# Patient Record
Sex: Male | Born: 1950
Health system: Southern US, Community
[De-identification: ages and names within clinical notes are randomized; demographics above are authoritative.]

## PROBLEM LIST (undated history)

## (undated) DIAGNOSIS — Z8619 Personal history of other infectious and parasitic diseases: Secondary | ICD-10-CM

## (undated) DIAGNOSIS — Z87442 Personal history of urinary calculi: Secondary | ICD-10-CM

## (undated) DIAGNOSIS — K2270 Barrett's esophagus without dysplasia: Secondary | ICD-10-CM

## (undated) DIAGNOSIS — C44611 Basal cell carcinoma of skin of unspecified upper limb, including shoulder: Secondary | ICD-10-CM

## (undated) DIAGNOSIS — B029 Zoster without complications: Secondary | ICD-10-CM

## (undated) DIAGNOSIS — K219 Gastro-esophageal reflux disease without esophagitis: Secondary | ICD-10-CM

## (undated) DIAGNOSIS — I2510 Atherosclerotic heart disease of native coronary artery without angina pectoris: Secondary | ICD-10-CM

## (undated) DIAGNOSIS — H01003 Unspecified blepharitis right eye, unspecified eyelid: Secondary | ICD-10-CM

## (undated) DIAGNOSIS — I340 Nonrheumatic mitral (valve) insufficiency: Secondary | ICD-10-CM

## (undated) DIAGNOSIS — I8390 Asymptomatic varicose veins of unspecified lower extremity: Secondary | ICD-10-CM

## (undated) DIAGNOSIS — R011 Cardiac murmur, unspecified: Secondary | ICD-10-CM

## (undated) DIAGNOSIS — N400 Enlarged prostate without lower urinary tract symptoms: Secondary | ICD-10-CM

## (undated) DIAGNOSIS — J189 Pneumonia, unspecified organism: Secondary | ICD-10-CM

## (undated) HISTORY — DX: Nonrheumatic mitral (valve) insufficiency: I34.0

## (undated) HISTORY — DX: Gastro-esophageal reflux disease without esophagitis: K21.9

## (undated) HISTORY — DX: Asymptomatic varicose veins of unspecified lower extremity: I83.90

## (undated) HISTORY — PX: TRIGEMINAL NERVE DECOMPRESSION: SHX2579

## (undated) HISTORY — PX: WISDOM TOOTH EXTRACTION: SHX21

## (undated) HISTORY — PX: UPPER GASTROINTESTINAL ENDOSCOPY: SHX188

## (undated) HISTORY — DX: Benign prostatic hyperplasia without lower urinary tract symptoms: N40.0

## (undated) HISTORY — PX: TONSILLECTOMY: SUR1361

## (undated) HISTORY — DX: Barrett's esophagus without dysplasia: K22.70

## (undated) HISTORY — PX: COLONOSCOPY: SHX174

## (undated) HISTORY — DX: Personal history of other infectious and parasitic diseases: Z86.19

## (undated) HISTORY — DX: Unspecified blepharitis right eye, unspecified eyelid: H01.003

## (undated) HISTORY — DX: Basal cell carcinoma of skin of unspecified upper limb, including shoulder: C44.611

## (undated) HISTORY — DX: Cardiac murmur, unspecified: R01.1

## (undated) HISTORY — DX: Zoster without complications: B02.9

---

## 2007-01-02 DIAGNOSIS — I340 Nonrheumatic mitral (valve) insufficiency: Secondary | ICD-10-CM

## 2007-01-02 HISTORY — DX: Nonrheumatic mitral (valve) insufficiency: I34.0

## 2010-05-15 LAB — HM COLONOSCOPY

## 2011-01-02 DIAGNOSIS — H01003 Unspecified blepharitis right eye, unspecified eyelid: Secondary | ICD-10-CM

## 2011-01-02 DIAGNOSIS — B029 Zoster without complications: Secondary | ICD-10-CM | POA: Insufficient documentation

## 2011-01-02 HISTORY — DX: Zoster without complications: B02.9

## 2011-01-02 HISTORY — DX: Unspecified blepharitis right eye, unspecified eyelid: H01.003

## 2011-04-23 DIAGNOSIS — H40009 Preglaucoma, unspecified, unspecified eye: Secondary | ICD-10-CM | POA: Insufficient documentation

## 2013-03-01 DIAGNOSIS — C44611 Basal cell carcinoma of skin of unspecified upper limb, including shoulder: Secondary | ICD-10-CM

## 2013-03-01 HISTORY — DX: Basal cell carcinoma of skin of unspecified upper limb, including shoulder: C44.611

## 2013-03-18 HISTORY — PX: SPIROMETRY: SHX456

## 2013-03-18 LAB — HEPATIC FUNCTION PANEL
ALK PHOS: 59 U/L (ref 25–125)
ALT: 15 U/L (ref 10–40)
AST: 13 U/L — AB (ref 14–40)
Bilirubin, Total: 1.1 mg/dL

## 2013-03-18 LAB — PSA: PSA: 0.4

## 2013-03-18 LAB — LIPID PANEL
CHOLESTEROL: 139 mg/dL (ref 0–200)
HDL: 44 mg/dL (ref 35–70)
LDL CALC: 71 mg/dL
LDl/HDL Ratio: 3.2
TRIGLYCERIDES: 121 mg/dL (ref 40–160)

## 2013-03-18 LAB — BASIC METABOLIC PANEL
BUN: 19 mg/dL (ref 4–21)
CREATININE: 1.1 mg/dL (ref 0.6–1.3)
GLUCOSE: 101 mg/dL
POTASSIUM: 5 mmol/L (ref 3.4–5.3)
Sodium: 142 mmol/L (ref 137–147)

## 2013-03-18 LAB — HEMOGLOBIN A1C: HEMOGLOBIN A1C: 5.4 % (ref 4.0–6.0)

## 2013-05-01 DIAGNOSIS — Z8619 Personal history of other infectious and parasitic diseases: Secondary | ICD-10-CM

## 2013-05-01 HISTORY — DX: Personal history of other infectious and parasitic diseases: Z86.19

## 2013-05-18 LAB — HM COLONOSCOPY

## 2014-02-24 LAB — HEMOGLOBIN A1C: Hgb A1c MFr Bld: 5.5 % (ref 4.0–6.0)

## 2014-02-24 LAB — HEPATIC FUNCTION PANEL
ALK PHOS: 58 U/L (ref 25–125)
ALT: 18 U/L (ref 10–40)
AST: 16 U/L (ref 14–40)
BILIRUBIN, TOTAL: 1.2 mg/dL

## 2014-02-24 LAB — LIPID PANEL
Cholesterol: 162 mg/dL (ref 0–200)
HDL: 50 mg/dL (ref 35–70)
LDL CALC: 91 mg/dL
LDL/HDL RATIO: 3.2
Triglycerides: 105 mg/dL (ref 40–160)

## 2014-02-24 LAB — BASIC METABOLIC PANEL
BUN: 19 mg/dL (ref 4–21)
Creatinine: 1.1 mg/dL (ref 0.6–1.3)
GLUCOSE: 91 mg/dL
Potassium: 4.7 mmol/L (ref 3.4–5.3)
Sodium: 140 mmol/L (ref 137–147)

## 2014-02-24 LAB — CBC AND DIFFERENTIAL
HEMATOCRIT: 52 % (ref 41–53)
Hemoglobin: 16.4 g/dL (ref 13.5–17.5)
NEUTROS ABS: 3247 /uL
Platelets: 211 10*3/uL (ref 150–399)
WBC: 4.2 10*3/mL

## 2014-09-01 ENCOUNTER — Telehealth: Payer: Self-pay | Admitting: General Practice

## 2014-09-01 NOTE — Telephone Encounter (Signed)
Okay to schedule at [employer]. Please have Pt have pertinent records faxed to [place], or have him bring them with him to his appt. Thank you.

## 2014-09-01 NOTE — Telephone Encounter (Signed)
lvm advising patient of message below °

## 2014-09-01 NOTE — Telephone Encounter (Signed)
Burney Calzadilla HOO#[id number] was seen today and her daughter Marco Raper stated CMA approved Dr. Larose Kells taking patient on as a new patient Breckyn Ticas).

## 2014-11-09 ENCOUNTER — Telehealth: Payer: Self-pay

## 2014-11-09 NOTE — Telephone Encounter (Signed)
Pre Visit call completed. 

## 2014-11-09 NOTE — Telephone Encounter (Signed)
Pre visit call completed 

## 2014-11-10 ENCOUNTER — Ambulatory Visit (INDEPENDENT_AMBULATORY_CARE_PROVIDER_SITE_OTHER): Payer: Managed Care, Other (non HMO) | Admitting: Internal Medicine

## 2014-11-10 VITALS — BP 118/78 | HR 76 | Temp 98.2°F | Ht 74.0 in | Wt 236.4 lb

## 2014-11-10 DIAGNOSIS — Z09 Encounter for follow-up examination after completed treatment for conditions other than malignant neoplasm: Secondary | ICD-10-CM

## 2014-11-10 DIAGNOSIS — Z125 Encounter for screening for malignant neoplasm of prostate: Secondary | ICD-10-CM

## 2014-11-10 DIAGNOSIS — Z0000 Encounter for general adult medical examination without abnormal findings: Secondary | ICD-10-CM | POA: Diagnosis not present

## 2014-11-10 DIAGNOSIS — Z23 Encounter for immunization: Secondary | ICD-10-CM

## 2014-11-10 MED ORDER — PANTOPRAZOLE SODIUM 40 MG PO TBEC: 40.0000 mg | DELAYED_RELEASE_TABLET | Freq: Every day | ORAL | Status: DC

## 2014-11-10 NOTE — Patient Instructions (Signed)
Front desk: please get a ROI form, fax it to the [facility], all records   Please schedule labs to be done within few days (fasting)      Next visit  for a   Physical exam in one year    Please schedule an appointment at the front desk Please come back fasting

## 2014-11-10 NOTE — Progress Notes (Signed)
Subjective:    Patient ID: Jacob Ray, male    DOB: [birth year], 64 y.o.   MRN: [id number]  DOS:  11/10/2014 Type of visit - description : New patient, CPX, transferring from the [place] clinic Interval history: No major concerns, would like to refill Protonix  Review of Systems  Constitutional: No fever. No chills. No unexplained wt changes. No unusual sweats  HEENT: No dental problems, no ear discharge, no facial swelling, no voice changes. No eye discharge, no eye  redness , no  intolerance to light   Respiratory: No wheezing , no  difficulty breathing. No cough , no mucus production  Cardiovascular: No CP, no leg swelling , no  Palpitations  GI: no nausea, no vomiting, no diarrhea , no  abdominal pain.  No blood in the stools. No dysphagia, no odynophagia    Endocrine: No polyphagia, no polyuria , no polydipsia  GU: No dysuria, gross hematuria, difficulty urinating. No urinary urgency, no frequency.  Musculoskeletal: No joint swellings or unusual aches or pains  Skin: No change in the color of the skin, palor , no  Rash  Allergic, immunologic: No environmental allergies , no  food allergies  Neurological: No dizziness no  syncope. No headaches. No diplopia, no slurred, no slurred speech, no motor deficits, no facial  Numbness  Hematological: No enlarged lymph nodes, no easy bruising , no unusual bleedings  Psychiatry: No suicidal ideas, no hallucinations, no beavior problems, no confusion.  No unusual/severe anxiety, no depression   Past Medical History  Diagnosis Date  . Blepharitis, right eye 2013  . Shingles 2013  . GERD (gastroesophageal reflux disease)   . Heart murmur     Past Surgical History  Procedure Laterality Date  . Tonsillectomy    . Wisdom tooth extraction      Social History   Social History  . Marital Status: Married    Spouse Name: N/A  . Number of Children: 1  . Years of Education: N/A   Occupational History  . sales      Social History Main Topics  . Smoking status: Never Smoker   . Smokeless tobacco: Not on file  . Alcohol Use: 0.0 oz/week    0 Standard drinks or equivalent per week     Comment: Occasional  . Drug Use: No  . Sexual Activity: Not on file   Other Topics Concern  . Not on file   Social History Narrative   Household-- pt and wife   Family History  Problem Relation Age of Onset  . CAD Father 73  . Cancer Father     Prostate Cancer  . Hypertension Father   . Diabetes Neg Hx   . Colon cancer Neg Hx     F ?  . Prostate cancer Neg Hx     F ?         Medication List       This list is accurate as of: 11/10/14 11:59 PM.  Always use your most recent med list.               pantoprazole 40 MG tablet  Commonly known as:  PROTONIX  Take 1 tablet (40 mg total) by mouth daily.           Objective:   Physical Exam BP 118/78 mmHg  Pulse 76  Temp(Src) 98.2 F (36.8 C) (Oral)  Ht 6\' 2"  (1.88 m)  Wt 236 lb 6 oz (107.219 kg)  BMI 30.34 kg/m2  SpO2 99% General:   Well developed, well nourished . NAD.  Neck:  No thyromegaly HEENT:  Normocephalic . Face symmetric, atraumatic Lungs:  CTA B Normal respiratory effort, no intercostal retractions, no accessory muscle use. Heart: RRR,  no murmur.  No pretibial edema bilaterally  Abdomen:  Not distended, soft, non-tender. No rebound or rigidity.  Rectal:  External abnormalities: none. Normal sphincter tone. No rectal masses or tenderness.  Stool brown  Prostate: Prostate gland firm and smooth, no enlargement, nodularity, tenderness, mass, asymmetry or induration.  Skin: Exposed areas without rash. Not pale. Not jaundice Neurologic:  alert & oriented X3.  Speech normal, gait appropriate for age and unassisted Strength symmetric and appropriate for age.  Psych: Cognition and judgment appear intact.  Cooperative with normal attention span and concentration.  Behavior appropriate. No anxious or depressed  appearing.    Assessment & Plan:   Assessment>  GERD -- GI [name] , s/p EGD x 2, no major findings per pt  Shingles 2013  Plan  GERD: Refill Protonix New patient: Get records, used to see Dr. Richardson Chiquito PA at the [facility] RTC 1 year

## 2014-11-10 NOTE — Assessment & Plan Note (Signed)
Td today flushot today CCS:  2 cscopes before, [name], had polyps , last Cscope ~ 2015 WNL per pt. Will get records  Prostate ca screening: DRE neg, labs Diet-exercise discussed

## 2014-11-10 NOTE — Telephone Encounter (Signed)
Pre Visit call completed. 

## 2014-11-10 NOTE — Progress Notes (Signed)
Pre visit review using our clinic review tool, if applicable. No additional management support is needed unless otherwise documented below in the visit note. 

## 2014-11-11 DIAGNOSIS — Z09 Encounter for follow-up examination after completed treatment for conditions other than malignant neoplasm: Secondary | ICD-10-CM | POA: Insufficient documentation

## 2014-11-11 NOTE — Assessment & Plan Note (Signed)
GERD: Refill Protonix New patient: Get records, used to see Dr. Richardson Chiquito PA at the [facility] RTC 1 year

## 2014-11-15 ENCOUNTER — Other Ambulatory Visit (INDEPENDENT_AMBULATORY_CARE_PROVIDER_SITE_OTHER): Payer: Managed Care, Other (non HMO)

## 2014-11-15 DIAGNOSIS — Z125 Encounter for screening for malignant neoplasm of prostate: Secondary | ICD-10-CM

## 2014-11-15 DIAGNOSIS — R7989 Other specified abnormal findings of blood chemistry: Secondary | ICD-10-CM

## 2014-11-15 DIAGNOSIS — Z0000 Encounter for general adult medical examination without abnormal findings: Secondary | ICD-10-CM | POA: Diagnosis not present

## 2014-11-15 LAB — COMPREHENSIVE METABOLIC PANEL
ALBUMIN: 4.1 g/dL (ref 3.5–5.2)
ALK PHOS: 52 U/L (ref 39–117)
ALT: 18 U/L (ref 0–53)
AST: 12 U/L (ref 0–37)
BILIRUBIN TOTAL: 0.8 mg/dL (ref 0.2–1.2)
BUN: 22 mg/dL (ref 6–23)
CO2: 29 mEq/L (ref 19–32)
CREATININE: 1.17 mg/dL (ref 0.40–1.50)
Calcium: 9.3 mg/dL (ref 8.4–10.5)
Chloride: 105 mEq/L (ref 96–112)
GFR: 66.99 mL/min (ref >60.00)
GLUCOSE: 104 mg/dL — AB (ref 70–99)
POTASSIUM: 4.4 meq/L (ref 3.5–5.1)
SODIUM: 141 meq/L (ref 135–145)
TOTAL PROTEIN: 6.7 g/dL (ref 6.0–8.3)

## 2014-11-15 LAB — CBC WITH DIFFERENTIAL/PLATELET
BASOS ABS: 0 10*3/uL (ref 0.0–0.1)
BASOS PCT: 0.8 % (ref 0.0–3.0)
EOS ABS: 0.1 10*3/uL (ref 0.0–0.7)
Eosinophils Relative: 2.8 % (ref 0.0–5.0)
HEMATOCRIT: 45.8 % (ref 39.0–52.0)
Hemoglobin: 15.8 g/dL (ref 13.0–17.0)
LYMPHS ABS: 1.2 10*3/uL (ref 0.7–4.0)
LYMPHS PCT: 26.4 % (ref 12.0–46.0)
MCHC: 34.4 g/dL (ref 30.0–36.0)
MCV: 93.2 fl (ref 78.0–100.0)
Monocytes Absolute: 0.4 10*3/uL (ref 0.1–1.0)
Monocytes Relative: 8.5 % (ref 3.0–12.0)
NEUTROS ABS: 2.8 10*3/uL (ref 1.4–7.7)
NEUTROS PCT: 61.5 % (ref 43.0–77.0)
PLATELETS: 233 10*3/uL (ref 150.0–400.0)
RBC: 4.92 Mil/uL (ref 4.22–5.81)
RDW: 12.8 % (ref 11.5–15.5)
WBC: 4.6 10*3/uL (ref 4.0–10.5)

## 2014-11-15 LAB — LIPID PANEL
CHOLESTEROL: 171 mg/dL (ref 0–200)
HDL: 44.7 mg/dL (ref >39.00)
NonHDL: 126.67
Total CHOL/HDL Ratio: 4
Triglycerides: 213 mg/dL — ABNORMAL HIGH (ref 0.0–149.0)
VLDL: 42.6 mg/dL — ABNORMAL HIGH (ref 0.0–40.0)

## 2014-11-15 LAB — TSH: TSH: 4.06 u[IU]/mL (ref 0.35–4.50)

## 2014-11-15 LAB — LDL CHOLESTEROL, DIRECT: LDL DIRECT: 93 mg/dL

## 2014-11-15 LAB — PSA: PSA: 0.45 ng/mL (ref 0.10–4.00)

## 2014-12-01 ENCOUNTER — Telehealth: Payer: Self-pay | Admitting: *Deleted

## 2014-12-01 NOTE — Telephone Encounter (Signed)
Forwarded to Dr. Paz. JG//CMA 

## 2014-12-13 ENCOUNTER — Encounter: Payer: Self-pay | Admitting: Internal Medicine

## 2014-12-13 NOTE — Telephone Encounter (Signed)
Labs reviewed, most recent labs are as follows:  Labs 03/18/2013: Cholesterol 139, LDL 71, creatinine 1.1, HIV test negative-A1c 5.4, PSA 0.4. C-reactive protein 0.1  Labs 02/24/2014: Cholesterol 162, LDL 91, creatinine function normal, A1c 5.5, hemoglobin 16.4.

## 2014-12-15 ENCOUNTER — Telehealth: Payer: Self-pay | Admitting: *Deleted

## 2014-12-15 ENCOUNTER — Telehealth: Payer: Self-pay

## 2014-12-15 NOTE — Telephone Encounter (Signed)
Received patient Medical Records, forwarded to provider/SLS

## 2014-12-15 NOTE — Telephone Encounter (Signed)
Medical records disc received from [facility] and placed in [place] for pick up

## 2014-12-20 ENCOUNTER — Telehealth: Payer: Self-pay | Admitting: Internal Medicine

## 2014-12-20 MED ORDER — PANTOPRAZOLE SODIUM 40 MG PO TBEC: 40.0000 mg | DELAYED_RELEASE_TABLET | Freq: Every day | ORAL | Status: DC

## 2014-12-20 NOTE — Telephone Encounter (Signed)
Relation to [id number] Call back [place], [place] - [address] [phone] (Phone) [phone] (Fax)        Reason for call:  Patient requesting a 90 day supply of pantoprazole (PROTONIX) 40 MG tablet  instead of 12 refills, patient states he would prefer to go to the pharmacy every quarter then every month. Please advise patient directly

## 2014-12-20 NOTE — Telephone Encounter (Signed)
Rx resent, #90 and 3 refills.

## 2014-12-29 ENCOUNTER — Encounter: Payer: Self-pay | Admitting: Internal Medicine

## 2015-04-06 NOTE — Telephone Encounter (Addendum)
More than 100 pages of records available for review, we'll only keep the most relevant ones. ---Colonoscopy done 05-2010, 05/18/2013, inadequate prep, no polyp, no BX, repeat in 5 years --- Esophageal  stricture, dilatation 07/12/2010 ---History of Barrett's esophagus, last EGD 05/04/2013: Pathology: Barrett's esophagitis, eosinophilic esophagitis, eosinophilic gastritis, gastric polyp, H. pylori infection. ---Repeat EGD planned for  May 2018. --- History of BPH, has seen urology. --- Skin cancer: BCC per biopsy 03/24/2013 --- Reportedly mild mitral regurgitation by echo 2009 --- Remote history of low vitamin D   We'll discuss above on return to the office

## 2015-04-07 ENCOUNTER — Encounter: Payer: Self-pay | Admitting: Internal Medicine

## 2015-11-14 ENCOUNTER — Ambulatory Visit (INDEPENDENT_AMBULATORY_CARE_PROVIDER_SITE_OTHER): Payer: Managed Care, Other (non HMO) | Admitting: Internal Medicine

## 2015-11-14 ENCOUNTER — Encounter: Payer: Self-pay | Admitting: Internal Medicine

## 2015-11-14 ENCOUNTER — Telehealth: Payer: Self-pay

## 2015-11-14 VITALS — BP 112/76 | HR 74 | Temp 98.1°F | Resp 14 | Ht 74.0 in | Wt 237.2 lb

## 2015-11-14 DIAGNOSIS — Z23 Encounter for immunization: Secondary | ICD-10-CM | POA: Diagnosis not present

## 2015-11-14 DIAGNOSIS — Z0000 Encounter for general adult medical examination without abnormal findings: Secondary | ICD-10-CM

## 2015-11-14 DIAGNOSIS — Z8619 Personal history of other infectious and parasitic diseases: Secondary | ICD-10-CM

## 2015-11-14 LAB — LIPID PANEL
CHOL/HDL RATIO: 3
Cholesterol: 157 mg/dL (ref 0–200)
HDL: 44.9 mg/dL (ref >39.00)
LDL CALC: 77 mg/dL (ref 0–99)
NONHDL: 112
TRIGLYCERIDES: 173 mg/dL — AB (ref 0.0–149.0)
VLDL: 34.6 mg/dL (ref 0.0–40.0)

## 2015-11-14 LAB — URINALYSIS, ROUTINE W REFLEX MICROSCOPIC
Bilirubin Urine: NEGATIVE
Hgb urine dipstick: NEGATIVE
KETONES UR: NEGATIVE
Leukocytes, UA: NEGATIVE
NITRITE: NEGATIVE
RBC / HPF: NONE SEEN (ref 0–?)
SPECIFIC GRAVITY, URINE: 1.02 (ref 1.000–1.030)
Total Protein, Urine: NEGATIVE
Urine Glucose: NEGATIVE
Urobilinogen, UA: 0.2 (ref 0.0–1.0)
pH: 6 (ref 5.0–8.0)

## 2015-11-14 LAB — BASIC METABOLIC PANEL
BUN: 17 mg/dL (ref 6–23)
CALCIUM: 9.1 mg/dL (ref 8.4–10.5)
CHLORIDE: 105 meq/L (ref 96–112)
CO2: 27 meq/L (ref 19–32)
Creatinine, Ser: 1.11 mg/dL (ref 0.40–1.50)
GFR: 70.96 mL/min (ref >60.00)
GLUCOSE: 98 mg/dL (ref 70–99)
Potassium: 4.4 mEq/L (ref 3.5–5.1)
SODIUM: 138 meq/L (ref 135–145)

## 2015-11-14 LAB — CBC WITH DIFFERENTIAL/PLATELET
BASOS PCT: 0.5 % (ref 0.0–3.0)
Basophils Absolute: 0 10*3/uL (ref 0.0–0.1)
EOS PCT: 1.8 % (ref 0.0–5.0)
Eosinophils Absolute: 0.1 10*3/uL (ref 0.0–0.7)
HEMATOCRIT: 43.9 % (ref 39.0–52.0)
HEMOGLOBIN: 15.1 g/dL (ref 13.0–17.0)
LYMPHS PCT: 34 % (ref 12.0–46.0)
Lymphs Abs: 1.5 10*3/uL (ref 0.7–4.0)
MCHC: 34.5 g/dL (ref 30.0–36.0)
MCV: 92 fl (ref 78.0–100.0)
MONO ABS: 0.4 10*3/uL (ref 0.1–1.0)
MONOS PCT: 10.1 % (ref 3.0–12.0)
Neutro Abs: 2.3 10*3/uL (ref 1.4–7.7)
Neutrophils Relative %: 53.6 % (ref 43.0–77.0)
Platelets: 238 10*3/uL (ref 150.0–400.0)
RBC: 4.77 Mil/uL (ref 4.22–5.81)
RDW: 13 % (ref 11.5–15.5)
WBC: 4.3 10*3/uL (ref 4.0–10.5)

## 2015-11-14 NOTE — Assessment & Plan Note (Signed)
GERD, stricture, Barrett's, history of H. Pylori infection: asx, not taking PPIs. Will check a H. Pylori breath test. I do recommend to proceed with endoscopy 05/2016. Kidney stone: Patient apparently passed a stone few weeks ago, did not seek medical attention. Now is symptom free. Checking a UA Varicose veins, right ankle: Recommend compression stockings to prevent further growth and problems. RTC one year

## 2015-11-14 NOTE — Telephone Encounter (Signed)
Wellness form completed. Copy sent for scanning, given to Pt also. Original form mailed to:  Iona Hansen Attn: Randolm Idol [address] [place] [address]

## 2015-11-14 NOTE — Assessment & Plan Note (Addendum)
Td 2016; flushot today   CCS:   [name],  records reviewed ---->  Colonoscopy done 05-2010, 05/18/2013, inadequate prep, no polyp, no BX, repeat in 5 years Prostate ca screening: + FH,  DRE neg 2016, check a PSA  Diet-exercise discussed

## 2015-11-14 NOTE — Progress Notes (Signed)
Subjective:    Patient ID: Jacob Ray, male    DOB: July 15, 1950, 65 y.o.   MRN: [id number]  DOS:  11/14/2015 Type of visit - description : CPX Interval history:since the last timew he was here, he thinks he passed a stone .See below    Review of Systems  Constitutional: No fever. No chills. No unexplained wt changes. No unusual sweats  HEENT: No dental problems, no ear discharge, no facial swelling, no voice changes. No eye discharge, no eye  redness , no  intolerance to light   Respiratory: No wheezing , no  difficulty breathing. No cough , no mucus production  Cardiovascular: No CP, no leg swelling , no  Palpitations  GI: no nausea, no vomiting, no diarrhea , no  abdominal pain.  No blood in the stools. No dysphagia, no odynophagia    Endocrine: No polyphagia, no polyuria , no polydipsia  GU:  6 weeks ago, experiencing left back pain, shortly after he noted he passed a stone. Denies fever, chills, gross hematuria. No further symptoms.  No dysuria,  difficulty urinating. No urinary urgency, no frequency.  Musculoskeletal: No joint swellings or unusual aches or pains  Skin: reports varicose veins R ankle, no sx , stable x years   Allergic, immunologic: No environmental allergies , no  food allergies  Neurological: No dizziness no  syncope. No headaches. No diplopia, no slurred, no slurred speech, no motor deficits, no facial  Numbness  Hematological: No enlarged lymph nodes, no easy bruising , no unusual bleedings  Psychiatry: No suicidal ideas, no hallucinations, no beavior problems, no confusion.  No unusual/severe anxiety, no depression   Past Medical History:  Diagnosis Date  . Barrett's esophagus    EGD on 05/04/2013, next due in 5/18, [facility]  . BCC (basal cell carcinoma), arm 03/2013   Right forearm  . Blepharitis, right eye 2013  . BPH (benign prostatic hyperplasia)   . GERD (gastroesophageal reflux disease)   . Heart murmur   . History of  Helicobacter pylori infection 05/2013  . Mitral regurgitation    mild regurgitation per ECHO in 2009  . Mitral regurgitation 2009   mild per ECHO  . Shingles 2013  . Varicose veins    bilateral    Past Surgical History:  Procedure Laterality Date  . SPIROMETRY  03/18/2013   [facility]  . TONSILLECTOMY    . WISDOM TOOTH EXTRACTION      Social History   Social History  . Marital status: Married    Spouse name: N/A  . Number of children: 1  . Years of education: N/A   Occupational History  . sales     Social History Main Topics  . Smoking status: Never Smoker  . Smokeless tobacco: Never Used  . Alcohol use 0.0 oz/week     Comment: Occasional  . Drug use: No  . Sexual activity: Not on file   Other Topics Concern  . Not on file   Social History Narrative   Household-- pt and wife     Family History  Problem Relation Age of Onset  . CAD Father 71  . Hypertension Father   . Prostate cancer Father     Prostate Cancer  . Diabetes Neg Hx   . Colon cancer Neg Hx     F ?       Medication List    as of 11/14/2015  1:14 PM   You have not been prescribed any medications.  Objective:   Physical Exam BP 112/76 (BP Location: Left Arm, Patient Position: Sitting, Cuff Size: Normal)   Pulse 74   Temp 98.1 F (36.7 C) (Oral)   Resp 14   Ht 6\' 2"  (1.88 m)   Wt 237 lb 4 oz (107.6 kg)   SpO2 97%   BMI 30.46 kg/m   General:   Well developed, well nourished . NAD.  Neck: No  thyromegaly  HEENT:  Normocephalic . Face symmetric, atraumatic Lungs:  CTA B Normal respiratory effort, no intercostal retractions, no accessory muscle use. Heart: RRR,  no murmur.  No pretibial edema bilaterally  Abdomen:  Not distended, soft, non-tender. No rebound or rigidity.   Skin: medial aspect of R ankle w/  noticeable varicose veins without ulcers or phlebitis Neurologic:  alert & oriented X3.  Speech normal, gait appropriate for age and  unassisted Strength symmetric and appropriate for age.  Psych: Cognition and judgment appear intact.  Cooperative with normal attention span and concentration.  Behavior appropriate. No anxious or depressed appearing.    Assessment & Plan:  Assessment>  GERD --   [name] , s/p EGD x 2, no major findings per pt  Esophageal  stricture, dilatation 07/12/2010 History of Barrett's esophagus, last EGD 05/04/2013: Pathology: Barrett's esophagitis, eosinophilic esophagitis, eosinophilic gastritis, gastric polyp, H. pylori infection. Repeat EGD planned for  May 2018. H/o  BPH, has seen urology. Skin cancer: BCC per biopsy 03/24/2013 Reportedly mild mitral regurgitation by echo 2009 Remote history of low vitamin D  Shingles 2013 H/o kidney stone ~ 09-2015 per pt report Varicose veins, right ankle  PLAN: GERD, stricture, Barrett's, history of H. Pylori infection: asx, not taking PPIs. Will check a H. Pylori breath test. I do recommend to proceed with endoscopy 05/2016. Kidney stone: Patient apparently passed a stone few weeks ago, did not seek medical attention. Now is symptom free. Checking a UA Varicose veins, right ankle: Recommend compression stockings to prevent further growth and problems. RTC one year

## 2015-11-14 NOTE — Progress Notes (Signed)
Pre visit review using our clinic review tool, if applicable. No additional management support is needed unless otherwise documented below in the visit note. 

## 2015-11-14 NOTE — Patient Instructions (Signed)
GO TO THE LAB : Get the blood work     GO TO THE FRONT DESK Schedule your next appointment for a physical exam in one year.  You need endoscopy by May 2018, if they don't contact your regards this procedure please let me know

## 2015-11-15 LAB — H. PYLORI BREATH TEST: H. PYLORI BREATH TEST: NOT DETECTED

## 2016-04-27 ENCOUNTER — Telehealth: Payer: Self-pay | Admitting: Internal Medicine

## 2016-04-27 NOTE — Telephone Encounter (Signed)
Please advise 

## 2016-04-27 NOTE — Telephone Encounter (Signed)
At check out Jacob Ray stated that Jacob Ray is due a bone density test. She would like to have orders placed.    Please assist further.

## 2016-04-27 NOTE — Telephone Encounter (Signed)
Tried calling Pt, no answer, unable to leave voice message.  

## 2016-04-27 NOTE — Telephone Encounter (Signed)
A bone density test is not routinely done on males unless there is a compelling reason, I don't think he needs a bone density test.

## 2016-06-14 ENCOUNTER — Telehealth: Payer: Self-pay | Admitting: Internal Medicine

## 2016-06-14 DIAGNOSIS — K22719 Barrett's esophagus with dysplasia, unspecified: Secondary | ICD-10-CM

## 2016-06-14 DIAGNOSIS — K2270 Barrett's esophagus without dysplasia: Secondary | ICD-10-CM | POA: Insufficient documentation

## 2016-06-14 NOTE — Telephone Encounter (Signed)
GI referral placed

## 2016-06-14 NOTE — Telephone Encounter (Signed)
According to my notes he is due for a EGD dx Barrett's , please refer to GI (I think GI is at [facility]) and let pt know

## 2016-06-14 NOTE — Telephone Encounter (Signed)
Letter mailed to Pt.

## 2016-07-16 ENCOUNTER — Encounter: Payer: Self-pay | Admitting: Gastroenterology

## 2016-09-04 ENCOUNTER — Ambulatory Visit (INDEPENDENT_AMBULATORY_CARE_PROVIDER_SITE_OTHER): Payer: Managed Care, Other (non HMO) | Admitting: Gastroenterology

## 2016-09-04 ENCOUNTER — Encounter: Payer: Self-pay | Admitting: Gastroenterology

## 2016-09-04 VITALS — BP 92/68 | HR 84 | Ht 74.0 in | Wt 236.8 lb

## 2016-09-04 DIAGNOSIS — K219 Gastro-esophageal reflux disease without esophagitis: Secondary | ICD-10-CM

## 2016-09-04 DIAGNOSIS — Z8719 Personal history of other diseases of the digestive system: Secondary | ICD-10-CM

## 2016-09-04 NOTE — Patient Instructions (Signed)
We have requested records from [name] at [facility]. We will contact you once we have received these records.  If you are age 64 or older, your body mass index should be between 23-30. Your Body mass index is 30.4 kg/m. If this is out of the aforementioned range listed, please consider follow up with your Primary Care Provider.  If you are age 9 or younger, your body mass index should be between 19-25. Your Body mass index is 30.4 kg/m. If this is out of the aformentioned range listed, please consider follow up with your Primary Care Provider.

## 2016-09-04 NOTE — Progress Notes (Signed)
HPI :  66 y/o male with a history of Barrett's esophagus, GERD, H pylori, referred by Dr. [name] for management of GERD and BArrett's esophagus.   He denies much GERD which bothers him now. She denies any routine antacids, only PRN. He has been on PPIs in the past, he thinks he stopped it years ago. He denies any dysphagia, no swallowing difficulty.  No FH of esophageal cancer. No abdominal pains. No nausea or vomiting. Weight is stable. No cigarettes, chews tobacco one per week. He drinks on average 6 beers per weekend not during the week. He had an EGD in 2015, but does not know the results. He is not aware of being told he had Barrett's in the past.   Father had prostate cancer. No colon cancer.   He thinks he has had a colonoscopy done in 2015 but can't recall he results. Done in [place] at [place].   EGD reportedly done on 05/04/2013 Colonoscopy 05/18/2013:  Past Medical History:  Diagnosis Date  . Barrett's esophagus    EGD on 05/04/2013, next due in 5/18, [facility]  . BCC (basal cell carcinoma), arm 03/2013   Right forearm  . Blepharitis, right eye 2013  . BPH (benign prostatic hyperplasia)   . GERD (gastroesophageal reflux disease)   . Heart murmur   . History of Helicobacter pylori infection 05/2013  . Mitral regurgitation 2009   mild per ECHO  . Shingles 2013  . Varicose veins    bilateral     Past Surgical History:  Procedure Laterality Date  . SPIROMETRY  03/18/2013   [facility]  . TONSILLECTOMY    . WISDOM TOOTH EXTRACTION     Family History  Problem Relation Age of Onset  . CAD Father 77  . Hypertension Father   . Prostate cancer Father        Prostate Cancer  . Diabetes Neg Hx   . Colon cancer Neg Hx        F ?   Social History  Substance Use Topics  . Smoking status: Never Smoker  . Smokeless tobacco: Never Used  . Alcohol use 0.0 oz/week     Comment: Occasional   No current outpatient prescriptions on file.   No current  facility-administered medications for this visit.    Allergies  Allergen Reactions  . Tetanus Toxoids Other (See Comments)    Childhood allergy, TDAP given on 11/10/2014, no reported reaction     Review of Systems: All systems reviewed and negative except where noted in HPI.   Lab Results  Component Value Date   WBC 4.3 11/14/2015   HGB 15.1 11/14/2015   HCT 43.9 11/14/2015   MCV 92.0 11/14/2015   PLT 238.0 11/14/2015    Lab Results  Component Value Date   CREATININE 1.11 11/14/2015   BUN 17 11/14/2015   NA 138 11/14/2015   K 4.4 11/14/2015   CL 105 11/14/2015   CO2 27 11/14/2015    Lab Results  Component Value Date   ALT 18 11/15/2014   AST 12 11/15/2014   ALKPHOS 52 11/15/2014   BILITOT 0.8 11/15/2014     Physical Exam: BP 92/68   Pulse 84   Ht 6\' 2"  (1.88 m)   Wt 236 lb 12.8 oz (107.4 kg)   BMI 30.40 kg/m  Constitutional: Pleasant,well-developed, male in no acute distress. HEENT: Normocephalic and atraumatic. Conjunctivae are normal. No scleral icterus. Neck supple.  Cardiovascular: Normal rate, regular rhythm.  Pulmonary/chest: Effort normal and breath sounds normal. No wheezing, rales or rhonchi. Abdominal: Soft, nondistended, nontender. . There are no masses palpable. No hepatomegaly. Extremities: no edema Lymphadenopathy: No cervical adenopathy noted. Neurological: Alert and oriented to person place and time. Skin: Skin is warm and dry. No rashes noted. Psychiatric: Normal mood and affect. Behavior is normal.   ASSESSMENT AND PLAN: 66 year old man with history as outlined above, seen in consultation for history of GERD and reported Barrett's esophagus.   I discussed Barrett's with the patient at length. He was not aware he had this diagnosis. I explained to him potential risks of dysplasia and esophageal cancer, although the overall risk for esophageal cancer for Barrett's remains quite low. I need to obtain his endoscopy report to determine if he  truly has this and if he requires surveillance EGD. If he does have Barrett's he will also be recommended that he take PPI once daily to reduce the risk of esophageal cancer in the setting of Barrett's esophagus. We discussed PPIs in general, he is is agreeable to this as needed. He otherwise has no symptoms from heartburn that bother him routinely. We will also recheck out to his prior GI physician to get his colonoscopy results to clarify when he is next due for colonoscopy.   I will recheck to him only have the results of his endoscopy with further recommendations. If he has Barrett's esophagus he will require another surveillance endoscopy at this time. If he has not heard from me within one week he should call back the office to see if we have received his records. He agreed with the plan.  [place] Cellar, MD [facility] Pager [phone]  CC: Colon Branch, MD

## 2016-09-12 ENCOUNTER — Telehealth: Payer: Self-pay | Admitting: Gastroenterology

## 2016-09-12 NOTE — Telephone Encounter (Signed)
Records from prior workup with Dr. Virgel Bouquet obtained as outlined:  Colonoscopy 05/18/2013 - poor prep reported, no polyps removed EGD 05/04/2013 - 6cm segment of Barrett's esophagus reported, (+) for nondysplastic Barrett's on pathology Colonoscopy 05/15/2010 - poor prep reported, no polyps removed EGD 07/12/2010 - distal esophageal stricture, Barrett's esophagus   Jacob Ray this patient has BArrett's esophagus. He is in need of a daily PPI which I previously discussed with him. Recommend omeprazole 20mg  daily at this time (he did not have much reflux at time of clinic visit) to reduce the risk of esophageal cancer. He is due for a surveillance EGD at this time if you can schedule it. Otherwise, both of his prior colonoscopies were reported to be poor prep's and inadequate for screening purposes. Recommend a colonoscopy at the same time as his EGD if he is willing given poor prep reported.  Can you help schedule him? Thanks

## 2016-09-13 ENCOUNTER — Telehealth: Payer: Self-pay

## 2016-09-13 NOTE — Telephone Encounter (Signed)
Left message for patient to call back, need to discuss recommendations and schedule procedure.

## 2016-09-13 NOTE — Telephone Encounter (Signed)
Pt states that he is returning your call 

## 2016-09-19 NOTE — Telephone Encounter (Signed)
I have not heard back from patient, sent letter with results and recommendations. Asked him to contact office to schedule procedure.

## 2016-09-25 ENCOUNTER — Encounter: Payer: Self-pay | Admitting: Gastroenterology

## 2016-10-24 ENCOUNTER — Ambulatory Visit (AMBULATORY_SURGERY_CENTER): Payer: Self-pay | Admitting: *Deleted

## 2016-10-24 ENCOUNTER — Encounter: Payer: Self-pay | Admitting: Gastroenterology

## 2016-10-24 VITALS — Ht 74.0 in | Wt 238.0 lb

## 2016-10-24 DIAGNOSIS — K2270 Barrett's esophagus without dysplasia: Secondary | ICD-10-CM

## 2016-10-24 DIAGNOSIS — Z1211 Encounter for screening for malignant neoplasm of colon: Secondary | ICD-10-CM

## 2016-10-24 MED ORDER — NA SULFATE-K SULFATE-MG SULF 17.5-3.13-1.6 GM/177ML PO SOLN: ORAL | 0 refills | Status: DC

## 2016-10-24 NOTE — Progress Notes (Signed)
Patient denies any allergies to eggs or soy. Patient denies any problems with anesthesia/sedation. Patient denies any oxygen use at home. Patient denies taking any diet/weight loss medications or blood thinners. EMMI education assisgned to patient on colonoscopy & EGD, this was explained and instructions given to patient. 2 day prep given.

## 2016-11-05 ENCOUNTER — Encounter: Payer: Managed Care, Other (non HMO) | Admitting: Gastroenterology

## 2016-11-15 ENCOUNTER — Other Ambulatory Visit: Payer: Self-pay

## 2016-11-15 ENCOUNTER — Encounter: Payer: Self-pay | Admitting: Gastroenterology

## 2016-11-15 ENCOUNTER — Ambulatory Visit (AMBULATORY_SURGERY_CENTER): Payer: Managed Care, Other (non HMO) | Admitting: Gastroenterology

## 2016-11-15 VITALS — BP 103/71 | HR 61 | Temp 97.1°F | Resp 16 | Ht 74.0 in | Wt 238.0 lb

## 2016-11-15 DIAGNOSIS — K2270 Barrett's esophagus without dysplasia: Secondary | ICD-10-CM

## 2016-11-15 DIAGNOSIS — Z1211 Encounter for screening for malignant neoplasm of colon: Secondary | ICD-10-CM

## 2016-11-15 DIAGNOSIS — Z1212 Encounter for screening for malignant neoplasm of rectum: Secondary | ICD-10-CM

## 2016-11-15 MED ORDER — SODIUM CHLORIDE 0.9 % IV SOLN: 500.0000 mL | INTRAVENOUS | Status: DC

## 2016-11-15 NOTE — Progress Notes (Signed)
Called to room to assist during endoscopic procedure.  Patient ID and intended procedure confirmed with present staff. Received instructions for my participation in the procedure from the performing physician.  

## 2016-11-15 NOTE — Op Note (Signed)
[name] Patient Name: Jacob Ray Procedure Date: 11/15/2016 2:45 PM MRN: [id number] Endoscopist: Remo Lipps P. [name] MD, MD Age: 66 Referring MD:  Date of Birth: 12/12/1950 Gender: Male Account #: [id number] Procedure:                Upper GI endoscopy Indications:              Follow-up of Barrett's esophagus - reported prior                            EGD with 6cm segment of nondysplastic Barrett's                            esophagus Medicines:                Monitored Anesthesia Care Procedure:                Pre-Anesthesia Assessment:                           - Prior to the procedure, a History and Physical                            was performed, and patient medications and                            allergies were reviewed. The patient's tolerance of                            previous anesthesia was also reviewed. The risks                            and benefits of the procedure and the sedation                            options and risks were discussed with the patient.                            All questions were answered, and informed consent                            was obtained. Prior Anticoagulants: The patient has                            taken no previous anticoagulant or antiplatelet                            agents. ASA Grade Assessment: II - A patient with                            mild systemic disease. After reviewing the risks                            and benefits, the patient was deemed in  satisfactory condition to undergo the procedure.                           After obtaining informed consent, the endoscope was                            passed under direct vision. Throughout the                            procedure, the patient's blood pressure, pulse, and                            oxygen saturations were monitored continuously. The                            Endoscope was introduced through the  mouth, and                            advanced to the second part of duodenum. The upper                            GI endoscopy was accomplished without difficulty.                            The patient tolerated the procedure well. Scope In: Scope Out: Findings:                 Esophagogastric landmarks were identified: the                            Z-line was found at 39 cm, the gastroesophageal                            junction was found at 39 cm and the upper extent of                            the gastric folds was found at 43 cm from the                            incisors.                           A 4 cm hiatal hernia was present.                           A very mild benign appearing stenosis was noted at                            the GEJ. The patient denied any dysphagia, no                            dilation performed. The Z-line was slightly  irregular with a slight extension of salmon colored                            mucosa, but no evidence of previously described                            long segment of Barrett's. Biopsies were taken with                            a cold forceps for histology of the GEJ given the                            patient's reported history of Barrett's.                           The exam of the esophagus was otherwise normal.                           Patchy mildly erythematous mucosa was found in the                            gastric antrum. Biopsies were taken with a cold                            forceps from the antrum, body, and incisua for                            Helicobacter pylori testing.                           The exam of the stomach was otherwise normal.                           The duodenal bulb and second portion of the                            duodenum were normal. Complications:            No immediate complications. Estimated blood loss:                             Minimal. Estimated Blood Loss:     Estimated blood loss was minimal. Impression:               - Esophagogastric landmarks identified.                           - 4 cm hiatal hernia.                           - Mild benign appearing stenosis at the GEJ                           - Z-line slightly irregular. Biopsied.                           -  Erythematous mucosa in the antrum. Biopsied.                           - Normal stomach otherwise.                           - Normal duodenal bulb and second portion of the                            duodenum. Recommendation:           - Patient has a contact number available for                            emergencies. The signs and symptoms of potential                            delayed complications were discussed with the                            patient. Return to normal activities tomorrow.                            Written discharge instructions were provided to the                            patient.                           - Resume previous diet.                           - Continue present medications.                           - Await pathology results. Remo Lipps P. [name] MD, MD 11/15/2016 3:46:20 PM This report has been signed electronically.

## 2016-11-15 NOTE — Progress Notes (Signed)
A/ox3, pleased with MAC, report to [facility]

## 2016-11-15 NOTE — Progress Notes (Signed)
Pt's states no medical or surgical changes since previsit or office visit. 

## 2016-11-15 NOTE — Patient Instructions (Signed)
YOU HAD AN ENDOSCOPIC PROCEDURE TODAY AT [facility]:   Refer to the procedure report that was given to you for any specific questions about what was found during the examination.  If the procedure report does not answer your questions, please call your gastroenterologist to clarify.  If you requested that your care partner not be given the details of your procedure findings, then the procedure report has been included in a sealed envelope for you to review at your convenience later.  YOU SHOULD EXPECT: Some feelings of bloating in the abdomen. Passage of more gas than usual.  Walking can help get rid of the air that was put into your GI tract during the procedure and reduce the bloating. If you had a lower endoscopy (such as a colonoscopy or flexible sigmoidoscopy) you may notice spotting of blood in your stool or on the toilet paper. If you underwent a bowel prep for your procedure, you may not have a normal bowel movement for a few days.  Please Note:  You might notice some irritation and congestion in your nose or some drainage.  This is from the oxygen used during your procedure.  There is no need for concern and it should clear up in a day or so.  SYMPTOMS TO REPORT IMMEDIATELY:   Following lower endoscopy (colonoscopy or flexible sigmoidoscopy):  Excessive amounts of blood in the stool  Significant tenderness or worsening of abdominal pains  Swelling of the abdomen that is new, acute  Fever of 100F or higher   Following upper endoscopy (EGD)  Vomiting of blood or coffee ground material  New chest pain or pain under the shoulder blades  Painful or persistently difficult swallowing  New shortness of breath  Fever of 100F or higher  Black, tarry-looking stools  For urgent or emergent issues, a gastroenterologist can be reached at any hour by calling [phone].   DIET:  We do recommend a small meal at first, but then you may proceed to your regular diet.  Drink  plenty of fluids but you should avoid alcoholic beverages for 24 hours.  ACTIVITY:  You should plan to take it easy for the rest of today and you should NOT DRIVE or use heavy machinery until tomorrow (because of the sedation medicines used during the test).    FOLLOW UP: Our staff will call the number listed on your records the next business day following your procedure to check on you and address any questions or concerns that you may have regarding the information given to you following your procedure. If we do not reach you, we will leave a message.  However, if you are feeling well and you are not experiencing any problems, there is no need to return our call.  We will assume that you have returned to your regular daily activities without incident.  If any biopsies were taken you will be contacted by phone or by letter within the next 1-3 weeks.  Please call us at [phone] if you have not heard about the biopsies in 3 weeks.   Await for biopsy results Hiatal Hernia (handout given) Hemorrhoid (handout given) Repeat Colonoscopy screening in 10 years   SIGNATURES/CONFIDENTIALITY: You and/or your care partner have signed paperwork which will be entered into your electronic medical record.  These signatures attest to the fact that that the information above on your After Visit Summary has been reviewed and is understood.  Full responsibility of the confidentiality of this discharge information  lies with you and/or your care-partner.

## 2016-11-15 NOTE — Op Note (Signed)
[facility] Patient Name: Jacob Ray Procedure Date: 11/15/2016 2:44 PM MRN: [id number] Endoscopist: Remo Lipps P. Armbruster MD, MD Age: 66 Referring MD:  Date of Birth: 02/18/1950 Gender: Male Account #: [id number] Procedure:                Colonoscopy Indications:              Screening for colorectal malignant neoplasm - last                            2 colonoscopies limited by poor prep Medicines:                Monitored Anesthesia Care Procedure:                Pre-Anesthesia Assessment:                           - Prior to the procedure, a History and Physical                            was performed, and patient medications and                            allergies were reviewed. The patient's tolerance of                            previous anesthesia was also reviewed. The risks                            and benefits of the procedure and the sedation                            options and risks were discussed with the patient.                            All questions were answered, and informed consent                            was obtained. Prior Anticoagulants: The patient has                            taken no previous anticoagulant or antiplatelet                            agents. ASA Grade Assessment: II - A patient with                            mild systemic disease. After reviewing the risks                            and benefits, the patient was deemed in                            satisfactory condition to undergo the procedure.  After obtaining informed consent, the colonoscope                            was passed under direct vision. Throughout the                            procedure, the patient's blood pressure, pulse, and                            oxygen saturations were monitored continuously. The                            Colonoscope was introduced through the anus and                            advanced to the  the cecum, identified by                            appendiceal orifice and ileocecal valve. The                            colonoscopy was performed without difficulty. The                            patient tolerated the procedure well. The quality                            of the bowel preparation was adequate. The                            ileocecal valve, appendiceal orifice, and rectum                            were photographed. Scope In: 3:09:57 PM Scope Out: 3:34:25 PM Scope Withdrawal Time: 0 hours 22 minutes 28 seconds  Total Procedure Duration: 0 hours 24 minutes 28 seconds  Findings:                 The perianal and digital rectal examinations were                            normal.                           A moderate amount of liquid stool was found in the                            entire colon, making visualization difficult                            initially. Several minutes were spent to lavage the                            colon resulting in clearance with adequate  visualization for a screening exam.                           Anal papilla(e) were hypertrophied.                           Internal hemorrhoids were found during                            retroflexion. The hemorrhoids were small.                           The exam was otherwise without abnormality. No                            polyps Complications:            No immediate complications. Estimated blood loss:                            None. Estimated Blood Loss:     Estimated blood loss: none. Impression:               - Fair prep initially, following lavage the exam                            was adequate for screening purposes.                           - Anal papilla(e) were hypertrophied.                           - Internal hemorrhoids.                           - The examination was otherwise normal. No polyps. Recommendation:           - Patient has a contact  number available for                            emergencies. The signs and symptoms of potential                            delayed complications were discussed with the                            patient. Return to normal activities tomorrow.                            Written discharge instructions were provided to the                            patient.                           - Resume previous diet.                           -  Continue present medications.                           - Repeat colonoscopy in 10 years for screening                            purposes. Remo Lipps P. Armbruster MD, MD 11/15/2016 3:39:59 PM This report has been signed electronically.

## 2016-11-16 ENCOUNTER — Telehealth: Payer: Self-pay | Admitting: *Deleted

## 2016-11-16 NOTE — Telephone Encounter (Signed)
  Follow up Call-  Call back number 11/15/2016  Post procedure Call Back phone  # [phone]  Permission to leave phone message Yes  Some recent data might be hidden     Patient questions:  Do you have a fever, pain , or abdominal swelling? No. Pain Score  0 *  Have you tolerated food without any problems? Yes.    Have you been able to return to your normal activities? Yes.    Do you have any questions about your discharge instructions: Diet   No. Medications  No. Follow up visit  No.  Do you have questions or concerns about your Care? No.  Actions: * If pain score is 4 or above: No action needed, pain <4.

## 2016-11-16 NOTE — Telephone Encounter (Deleted)
  Follow up Call-  Call back number 11/15/2016  Post procedure Call Back phone  # [phone]  Permission to leave phone message Yes  Some recent data might be hidden     Patient questions:  Do you have a fever, pain , or abdominal swelling? {yes no:314532} Pain Score  {NUMBERS; 0-10:5044} *  Have you tolerated food without any problems? {yes no:314532}  Have you been able to return to your normal activities? {yes no:314532}  Do you have any questions about your discharge instructions: Diet   {yes no:314532} Medications  {yes no:314532} Follow up visit  {yes no:314532}  Do you have questions or concerns about your Care? {yes no:314532}  Actions: * If pain score is 4 or above: {ACTION; [facility] ENDO PAIN >4:21563::"No action needed, pain <4."}

## 2016-11-21 ENCOUNTER — Other Ambulatory Visit: Payer: Self-pay

## 2016-11-21 MED ORDER — OMEPRAZOLE 20 MG PO CPDR: 20.0000 mg | DELAYED_RELEASE_CAPSULE | Freq: Every day | ORAL | 3 refills | Status: DC

## 2017-01-28 ENCOUNTER — Telehealth: Payer: Self-pay | Admitting: Internal Medicine

## 2017-01-28 DIAGNOSIS — Z0000 Encounter for general adult medical examination without abnormal findings: Secondary | ICD-10-CM

## 2017-01-28 NOTE — Telephone Encounter (Signed)
Copied from [place] [id number]. Topic: Quick Communication - See Telephone Encounter >> Jan 28, 2017 10:38 AM Rosalin Hawking wrote: CRM for notification. See Telephone encounter for:  01/28/17.   Pt's wife Jacob Ray) set up a cpe appt for pt on 02-04-2017 in the afternoon at 2:00. Pt's spouse would like to know if possible to have orders for lab for pt to come in on 02-04-2017 in the morning before seeing provider in the afternoon. Wife would like to be called to inform if orders is put in for pt. Please advise.

## 2017-01-28 NOTE — Telephone Encounter (Signed)
Arrange for CMP, FLP, CBC, PSA 1 week before next visit

## 2017-01-28 NOTE — Telephone Encounter (Signed)
Orders placed. Mervin Hack- please call Pt's wife- informed her labs are in for Pt- however needs to have labs several days before visit not day of as they won't be back in time if drawn same day.

## 2017-01-28 NOTE — Telephone Encounter (Signed)
Please advise 

## 2017-01-30 NOTE — Telephone Encounter (Signed)
Called pt and scheduled appt on 01/31/17 for labs prior to CPE

## 2017-01-31 ENCOUNTER — Other Ambulatory Visit (INDEPENDENT_AMBULATORY_CARE_PROVIDER_SITE_OTHER): Payer: Managed Care, Other (non HMO)

## 2017-01-31 DIAGNOSIS — Z0000 Encounter for general adult medical examination without abnormal findings: Secondary | ICD-10-CM | POA: Diagnosis not present

## 2017-01-31 LAB — LIPID PANEL
CHOL/HDL RATIO: 4
CHOLESTEROL: 148 mg/dL (ref 0–200)
HDL: 39.2 mg/dL (ref >39.00)
LDL CALC: 81 mg/dL (ref 0–99)
NonHDL: 108.95
TRIGLYCERIDES: 138 mg/dL (ref 0.0–149.0)
VLDL: 27.6 mg/dL (ref 0.0–40.0)

## 2017-01-31 LAB — COMPREHENSIVE METABOLIC PANEL
ALBUMIN: 4.1 g/dL (ref 3.5–5.2)
ALT: 16 U/L (ref 0–53)
AST: 12 U/L (ref 0–37)
Alkaline Phosphatase: 46 U/L (ref 39–117)
BILIRUBIN TOTAL: 0.9 mg/dL (ref 0.2–1.2)
BUN: 26 mg/dL — ABNORMAL HIGH (ref 6–23)
CALCIUM: 9 mg/dL (ref 8.4–10.5)
CO2: 30 meq/L (ref 19–32)
CREATININE: 1.32 mg/dL (ref 0.40–1.50)
Chloride: 102 mEq/L (ref 96–112)
GFR: 57.87 mL/min — AB (ref >60.00)
Glucose, Bld: 98 mg/dL (ref 70–99)
Potassium: 4.4 mEq/L (ref 3.5–5.1)
Sodium: 138 mEq/L (ref 135–145)
Total Protein: 6.7 g/dL (ref 6.0–8.3)

## 2017-01-31 LAB — CBC WITH DIFFERENTIAL/PLATELET
BASOS ABS: 0 10*3/uL (ref 0.0–0.1)
Basophils Relative: 0.7 % (ref 0.0–3.0)
EOS ABS: 0.1 10*3/uL (ref 0.0–0.7)
Eosinophils Relative: 2.2 % (ref 0.0–5.0)
HCT: 45.2 % (ref 39.0–52.0)
HEMOGLOBIN: 15.7 g/dL (ref 13.0–17.0)
Lymphocytes Relative: 34.5 % (ref 12.0–46.0)
Lymphs Abs: 1.8 10*3/uL (ref 0.7–4.0)
MCHC: 34.8 g/dL (ref 30.0–36.0)
MCV: 92.3 fl (ref 78.0–100.0)
MONO ABS: 0.5 10*3/uL (ref 0.1–1.0)
Monocytes Relative: 10.4 % (ref 3.0–12.0)
Neutro Abs: 2.7 10*3/uL (ref 1.4–7.7)
Neutrophils Relative %: 52.2 % (ref 43.0–77.0)
PLATELETS: 244 10*3/uL (ref 150.0–400.0)
RBC: 4.89 Mil/uL (ref 4.22–5.81)
RDW: 12.7 % (ref 11.5–15.5)
WBC: 5.2 10*3/uL (ref 4.0–10.5)

## 2017-01-31 LAB — PSA: PSA: 0.44 ng/mL (ref 0.10–4.00)

## 2017-02-04 ENCOUNTER — Ambulatory Visit (INDEPENDENT_AMBULATORY_CARE_PROVIDER_SITE_OTHER): Payer: Managed Care, Other (non HMO) | Admitting: Internal Medicine

## 2017-02-04 ENCOUNTER — Encounter: Payer: Self-pay | Admitting: Internal Medicine

## 2017-02-04 VITALS — BP 124/80 | HR 68 | Temp 98.3°F | Resp 14 | Ht 74.0 in | Wt 238.2 lb

## 2017-02-04 DIAGNOSIS — Z0000 Encounter for general adult medical examination without abnormal findings: Secondary | ICD-10-CM | POA: Diagnosis not present

## 2017-02-04 DIAGNOSIS — R799 Abnormal finding of blood chemistry, unspecified: Secondary | ICD-10-CM

## 2017-02-04 NOTE — Patient Instructions (Addendum)
     GO TO THE FRONT DESK Come back in 4 weeks for labs only (BMP) , no need to fast  Schedule your next appointment for a physical exam in 1 year

## 2017-02-04 NOTE — Progress Notes (Signed)
Pre visit review using our clinic review tool, if applicable. No additional management support is needed unless otherwise documented below in the visit note. 

## 2017-02-04 NOTE — Assessment & Plan Note (Addendum)
-  Td 2016; had a flushot.  Shingrx discussed -CCS:   [name],  records reviewed ---->  Colonoscopy done 05-2010, 05/18/2013, inadequate prep, no polyp, no BX, repeat in 5 years. Colonoscopy 11-2016 at [facility]: No polyps, internal hemorrhoids, 10 years -Prostate ca screening: + FH,  DRE today normal.  Recent PSA normal. -Diet,exercise discussed  -Labs from few days ago reviewed with the patient, they look very good, BUN slightly elevated, patient admits he does not drink enough fluids.  Recheck a BMP in 4 weeks.

## 2017-02-04 NOTE — Progress Notes (Signed)
Subjective:    Patient ID: Jacob Ray, male    DOB: 1950-05-11, 67 y.o.   MRN: [id number]  DOS:  02/04/2017 Type of visit - description : CPX Interval history: Feeling well, no major concerns.  Review of Systems  A 14 point review of systems is negative    Past Medical History:  Diagnosis Date  . Barrett's esophagus    EGD on 05/04/2013, next due in 5/18, [facility]  . BCC (basal cell carcinoma), arm 03/2013   Right forearm  . Blepharitis, right eye 2013  . BPH (benign prostatic hyperplasia)   . GERD (gastroesophageal reflux disease)   . Heart murmur   . History of Helicobacter pylori infection 05/2013  . Mitral regurgitation 2009   mild per ECHO  . Shingles 2013  . Varicose veins    bilateral    Past Surgical History:  Procedure Laterality Date  . SPIROMETRY  03/18/2013   [facility]  . TONSILLECTOMY    . WISDOM TOOTH EXTRACTION      Social History   Socioeconomic History  . Marital status: Married    Spouse name: Not on file  . Number of children: 1  . Years of education: Not on file  . Highest education level: Not on file  Social Needs  . Financial resource strain: Not on file  . Food insecurity - worry: Not on file  . Food insecurity - inability: Not on file  . Transportation needs - medical: Not on file  . Transportation needs - non-medical: Not on file  Occupational History  . Occupation: Press photographer , works full time   Tobacco Use  . Smoking status: Never Smoker  . Smokeless tobacco: Never Used  Substance and Sexual Activity  . Alcohol use: Yes    Alcohol/week: 0.0 oz    Comment: Occasional  . Drug use: No  . Sexual activity: Not on file  Other Topics Concern  . Not on file  Social History Narrative   Household-- pt and wife     Family History  Problem Relation Age of Onset  . CAD Father 2  . Hypertension Father   . Prostate cancer Father 67  . Diabetes Neg Hx   . Colon cancer Neg Hx        F ?     Allergies as of  02/04/2017      Reactions   Tetanus Toxoids Other (See Comments)   Childhood allergy, TDAP given on 11/10/2014, no reported reaction      Medication List        Accurate as of 02/04/17 11:59 PM. Always use your most recent med list.          omeprazole 20 MG capsule Commonly known as:  PRILOSEC Take 1 capsule (20 mg total) by mouth daily.          Objective:   Physical Exam BP 124/80 (BP Location: Left Arm, Patient Position: Sitting, Cuff Size: Normal)   Pulse 68   Temp 98.3 F (36.8 C) (Oral)   Resp 14   Ht 6\' 2"  (1.88 m)   Wt 238 lb 4 oz (108.1 kg)   SpO2 97%   BMI 30.59 kg/m  General:   Well developed, well nourished . NAD.  Neck: No  thyromegaly  HEENT:  Normocephalic . Face symmetric, atraumatic Lungs:  CTA B Normal respiratory effort, no intercostal retractions, no accessory muscle use. Heart: RRR,  no murmur.  No pretibial edema bilaterally  Abdomen:  Not distended, soft, non-tender. No rebound or rigidity.   Skin: Exposed areas without rash. Not pale. Not jaundice Rectal:  External abnormalities: none. Normal sphincter tone. No rectal masses or tenderness.  No stools  Prostate: Prostate gland firm and smooth, no enlargement, nodularity, tenderness, mass, asymmetry or induration.  Neurologic:  alert & oriented X3.  Speech normal, gait appropriate for age and unassisted Strength symmetric and appropriate for age.  Psych: Cognition and judgment appear intact.  Cooperative with normal attention span and concentration.  Behavior appropriate. No anxious or depressed appearing.     Assessment & Plan:    Assessment  GERD --   [name] , s/p EGD x 2, no major findings per pt  Esophageal  stricture, dilatation 07/12/2010 History of Barrett's esophagus, last EGD 05/04/2013: Pathology: Barrett's esophagitis, eosinophilic esophagitis, eosinophilic gastritis, gastric polyp, H. pylori infection. H. pylori testing 11-2015: Negative EGD, [name],  11/2016: + Bx for Barrett's , EGD 3 years. H/o  BPH, has seen urology. Skin cancer: BCC per biopsy 03/24/2013, sees derm as off 02-2017 Reportedly mild mitral regurgitation by echo 2009 Remote history of low vitamin D  Shingles 2013 H/o kidney stone ~ 09-2015 per pt report Varicose veins, right ankle  PLAN: GERD, stricture, Barrett's EGD 11-2016 confirmed  Barrett's, next in 3 years.  On chronic PPIs per GI RTC 1 year

## 2017-02-05 NOTE — Assessment & Plan Note (Signed)
GERD, stricture, Barrett's EGD 11-2016 confirmed  Barrett's, next in 3 years.  On chronic PPIs per GI RTC 1 year

## 2017-03-04 ENCOUNTER — Other Ambulatory Visit (INDEPENDENT_AMBULATORY_CARE_PROVIDER_SITE_OTHER): Payer: Managed Care, Other (non HMO)

## 2017-03-04 DIAGNOSIS — R799 Abnormal finding of blood chemistry, unspecified: Secondary | ICD-10-CM

## 2017-03-04 LAB — BASIC METABOLIC PANEL
BUN: 17 mg/dL (ref 6–23)
CO2: 27 meq/L (ref 19–32)
Calcium: 9.5 mg/dL (ref 8.4–10.5)
Chloride: 105 mEq/L (ref 96–112)
Creatinine, Ser: 1.15 mg/dL (ref 0.40–1.50)
GFR: 67.83 mL/min (ref >60.00)
Glucose, Bld: 102 mg/dL — ABNORMAL HIGH (ref 70–99)
POTASSIUM: 4.6 meq/L (ref 3.5–5.1)
SODIUM: 139 meq/L (ref 135–145)

## 2017-09-24 ENCOUNTER — Telehealth: Payer: Self-pay | Admitting: Gastroenterology

## 2017-09-24 NOTE — Telephone Encounter (Signed)
Pt return call please call him [phone]

## 2017-09-24 NOTE — Telephone Encounter (Signed)
Patient states medication omeprazole is not working and he is requesting to speak with nurse to discuss further.

## 2017-09-24 NOTE — Telephone Encounter (Signed)
Patient states that since being on omeprazole he has noticed a loss of muscle mass. He states that the only time he is really bothered by his GERD symptoms is on the weekends when he over indulges. He has tried taking Tums when this happens and has worked well for him. Please advise.

## 2017-09-24 NOTE — Telephone Encounter (Signed)
Returned patient's call, lvm to call back.

## 2017-09-24 NOTE — Telephone Encounter (Signed)
Thanks for the note. Loss of muscle mass would be an unusual reaction to omeprazole, I have not heard of that association before, although anything is possible regarding medication effects. He has a history of Barrett's esophagus, the risk of which without treatment is esophageal cancer. Guidelines recommend a PPI daily in the presence of Barrett's to reduce this risk. He previously had a 6cm segment on remote EGD, and on the endoscopy we did the segment was quite short. Given his Barrett's, long term PPI is recommend. If he is not doing well on omeprazole or thinks he is having side effects, would try protonix 20mg  once daily and see if he tolerates that better. Thanks

## 2017-09-24 NOTE — Telephone Encounter (Signed)
Left message for patient to call back  

## 2017-09-25 NOTE — Telephone Encounter (Signed)
Left message for patient to call back  

## 2017-09-30 NOTE — Telephone Encounter (Signed)
Left message for patient at a different number, [phone] per his wife. Let him know that [name] has not heard of muscle mass loss but could try another PPI, Protonix 20 mg to see if he tolerates that medication. Due to Barrett's he will need to be on some kind of PPI. Asked him to call back.

## 2017-10-01 NOTE — Telephone Encounter (Signed)
I have not heard back from patient, sent him a letter with Dr. Doyne Keel recommendations.

## 2017-12-06 ENCOUNTER — Other Ambulatory Visit: Payer: Self-pay | Admitting: Gastroenterology

## 2017-12-06 MED ORDER — PANTOPRAZOLE SODIUM 20 MG PO TBEC: 20.0000 mg | DELAYED_RELEASE_TABLET | Freq: Every day | ORAL | 1 refills | Status: DC

## 2017-12-06 NOTE — Telephone Encounter (Signed)
Called and spoke to pt.  He would like to try another PPI, as [name] suggested. He would like for me to send in Protonix 20 mg once daily.

## 2017-12-06 NOTE — Telephone Encounter (Signed)
Refill request for Prilosec rec'd but notes in chart indicate that this had caused patient muscle mass loss and [name] had switched him to Protonix 20mg  once daily.  LM for back to call back to discuss refill request.

## 2018-02-10 ENCOUNTER — Encounter: Payer: Managed Care, Other (non HMO) | Admitting: Internal Medicine

## 2018-02-21 ENCOUNTER — Ambulatory Visit (INDEPENDENT_AMBULATORY_CARE_PROVIDER_SITE_OTHER): Payer: BLUE CROSS/BLUE SHIELD | Admitting: Internal Medicine

## 2018-02-21 ENCOUNTER — Encounter: Payer: Self-pay | Admitting: Internal Medicine

## 2018-02-21 VITALS — BP 124/72 | HR 72 | Temp 98.1°F | Resp 16 | Ht 74.0 in | Wt 237.4 lb

## 2018-02-21 DIAGNOSIS — Z1159 Encounter for screening for other viral diseases: Secondary | ICD-10-CM

## 2018-02-21 DIAGNOSIS — R079 Chest pain, unspecified: Secondary | ICD-10-CM

## 2018-02-21 DIAGNOSIS — E559 Vitamin D deficiency, unspecified: Secondary | ICD-10-CM | POA: Diagnosis not present

## 2018-02-21 DIAGNOSIS — Z8249 Family history of ischemic heart disease and other diseases of the circulatory system: Secondary | ICD-10-CM

## 2018-02-21 DIAGNOSIS — Z0000 Encounter for general adult medical examination without abnormal findings: Secondary | ICD-10-CM | POA: Diagnosis not present

## 2018-02-21 DIAGNOSIS — Z23 Encounter for immunization: Secondary | ICD-10-CM

## 2018-02-21 MED ORDER — NITROGLYCERIN 0.4 MG SL SUBL: 0.4000 mg | SUBLINGUAL_TABLET | SUBLINGUAL | 1 refills | Status: DC | PRN

## 2018-02-21 NOTE — Progress Notes (Signed)
Subjective:    Patient ID: Jacob Ray, male    DOB: November 30, 1950, 68 y.o.   MRN: [id number]  DOS:  02/21/2018 Type of visit - description: CPX Has a couple of concerns.   Review of Systems Several months history of chest pain, typically after he does something like yard work, he is located at the anterior left chest, last 2 to 3 minutes. Has been associated with cough, mild shortness of breath; denies associated nausea, diaphoresis or vomiting. He is concerned about his heart.  Also, continue with occasional heartburn, typically GERD symptoms are on Fridays when he has "6 beers, dinner and then go to bed".   Other than above, a 14 point review of systems is negative   Past Medical History:  Diagnosis Date  . Barrett's esophagus    EGD on 05/04/2013, next due in 5/18, [facility]  . BCC (basal cell carcinoma), arm 03/2013   Right forearm  . Blepharitis, right eye 2013  . BPH (benign prostatic hyperplasia)   . GERD (gastroesophageal reflux disease)   . Heart murmur   . History of Helicobacter pylori infection 05/2013  . Mitral regurgitation 2009   mild per ECHO  . Shingles 2013  . Varicose veins    bilateral    Past Surgical History:  Procedure Laterality Date  . SPIROMETRY  03/18/2013   [facility]  . TONSILLECTOMY    . WISDOM TOOTH EXTRACTION      Social History   Socioeconomic History  . Marital status: Married    Spouse name: Not on file  . Number of children: 1  . Years of education: Not on file  . Highest education level: Not on file  Occupational History  . Occupation: Press photographer , works full time   Social Needs  . Financial resource strain: Not on file  . Food insecurity:    Worry: Not on file    Inability: Not on file  . Transportation needs:    Medical: Not on file    Non-medical: Not on file  Tobacco Use  . Smoking status: Never Smoker  . Smokeless tobacco: Never Used  Substance and Sexual Activity  . Alcohol use: Yes   Alcohol/week: 0.0 standard drinks    Comment: Occasional  . Drug use: No  . Sexual activity: Not on file  Lifestyle  . Physical activity:    Days per week: Not on file    Minutes per session: Not on file  . Stress: Not on file  Relationships  . Social connections:    Talks on phone: Not on file    Gets together: Not on file    Attends religious service: Not on file    Active member of club or organization: Not on file    Attends meetings of clubs or organizations: Not on file    Relationship status: Not on file  . Intimate partner violence:    Fear of current or ex partner: Not on file    Emotionally abused: Not on file    Physically abused: Not on file    Forced sexual activity: Not on file  Other Topics Concern  . Not on file  Social History Narrative   Household-- pt and wife   Adult child      Family History  Problem Relation Age of Onset  . CAD Father 27  . Hypertension Father   . Prostate cancer Father 49  . Valvular heart disease Brother  46  . CAD Brother        32  . Diabetes Brother   . Obesity Brother   . Colon cancer Neg Hx        F ?     Allergies as of 02/21/2018   No Known Allergies     Medication List       Accurate as of February 21, 2018 11:59 PM. Always use your most recent med list.        aspirin EC 81 MG tablet Take 81 mg by mouth daily.   nitroGLYCERIN 0.4 MG SL tablet Commonly known as:  NITROSTAT Place 1 tablet (0.4 mg total) under the tongue every 5 (five) minutes x 3 doses as needed for chest pain.   pantoprazole 20 MG tablet Commonly known as:  PROTONIX Take 1 tablet (20 mg total) by mouth daily.           Objective:   Physical Exam BP 124/72 (BP Location: Left Arm, Patient Position: Sitting, Cuff Size: Normal)   Pulse 72   Temp 98.1 F (36.7 C) (Oral)   Resp 16   Ht 6\' 2"  (1.88 m)   Wt 237 lb 6 oz (107.7 kg)   SpO2 97%   BMI 30.48 kg/m  General: Well developed, NAD, BMI noted Neck: No  thyromegaly    HEENT:  Normocephalic . Face symmetric, atraumatic Lungs:  CTA B Normal respiratory effort, no intercostal retractions, no accessory muscle use. Heart: RRR,  no murmur.  No pretibial edema bilaterally  Abdomen:  Not distended, soft, non-tender. No rebound or rigidity.   Skin: Exposed areas without rash. Not pale. Not jaundice Neurologic:  alert & oriented X3.  Speech normal, gait appropriate for age and unassisted Strength symmetric and appropriate for age.  Psych: Cognition and judgment appear intact.  Cooperative with normal attention span and concentration.  Behavior appropriate. No anxious or depressed appearing.     Assessment      Assessment  GERD --   [name] , s/p EGD x 2, no major findings per pt  Esophageal  stricture, dilatation 07/12/2010 History of Barrett's esophagus, last EGD 05/04/2013: Pathology: Barrett's esophagitis, eosinophilic esophagitis, eosinophilic gastritis, gastric polyp, H. pylori infection. H. pylori testing 11-2015: Negative EGD, [name], 11/2016: + Bx for Barrett's , EGD 3 years. H/o  BPH, has seen urology. Skin cancer: BCC per biopsy 03/24/2013, sees derm as off 02-2017 Reportedly mild mitral regurgitation by echo 2009 Remote history of low vitamin D  Shingles 2013 H/o kidney stone ~ 09-2015 per pt report Varicose veins, right ankle  PLAN: GERD: See HPI, most likely symptoms are d/t excessive etoh and late dinners, recommend no more than 2 EtOH servings at night, good GERD precaution, no late dinner.  If  symptoms continue, needs to call GI. BPH: No recent symptoms, does not see urology Skin cancer: Recommend to see dermatology yearly. Chest pain: Pain is going on for few months, exertional, worrisome for CAD.  Had a stress test in 2016, unable to see the results. EKG today, no acute changes. Plan: CXR, Aspirin, NTG, cardiology referral, will ask to see next week.  ER if symptoms severe or persistent.  Avoid excessive  exertion. RTC 6 months  Today, I spent more than 20   min with the patient in addition to CPX: >50% of the time counseling regards new onset CP

## 2018-02-21 NOTE — Progress Notes (Signed)
Pre visit review using our clinic review tool, if applicable. No additional management support is needed unless otherwise documented below in the visit note. 

## 2018-02-21 NOTE — Assessment & Plan Note (Addendum)
-  Td 2016; had a flushot.  Shingrx discussed last year; pnm 29: today -CCS:   [name],  records reviewed ---->  Colonoscopy done 05-2010, 05/18/2013, inadequate prep, no polyp, no BX, repeat in 5 years. Colonoscopy 11-2016 at [facility]: No polyps, internal hemorrhoids, 10 years -Prostate ca screening: + FH,  DRE- PSA wnl  2019   -Diet discussed  -Labs: Will come back fasting for CMP, FLP, CBC, A1c, TSH, hep C, vitamin D

## 2018-02-21 NOTE — Patient Instructions (Signed)
GO TO THE FRONT DESK  Schedule labs to be done Monday, fasting  Schedule your next appointment   checkup in 6 months   === Start aspirin 81 mg daily.  Avoid excessive exercise  If you have chest pain: Use nitroglycerin every 5 minutes 3 times. If you are not better call 911 If the chest pain is severe:   Call 911.

## 2018-02-23 NOTE — Assessment & Plan Note (Signed)
GERD: See HPI, most likely symptoms are d/t excessive etoh and late dinners, recommend no more than 2 EtOH servings at night, good GERD precaution, no late dinner.  If  symptoms continue, needs to call GI. BPH: No recent symptoms, does not see urology Skin cancer: Recommend to see dermatology yearly. Chest pain: Pain is going on for few months, exertional, worrisome for CAD.  Had a stress test in 2016, unable to see the results. EKG today, no acute changes. Plan: CXR, Aspirin, NTG, cardiology referral, will ask to see next week.  ER if symptoms severe or persistent.  Avoid excessive exertion. RTC 6 months

## 2018-02-24 ENCOUNTER — Other Ambulatory Visit (INDEPENDENT_AMBULATORY_CARE_PROVIDER_SITE_OTHER): Payer: BLUE CROSS/BLUE SHIELD

## 2018-02-24 DIAGNOSIS — Z1159 Encounter for screening for other viral diseases: Secondary | ICD-10-CM

## 2018-02-24 DIAGNOSIS — E559 Vitamin D deficiency, unspecified: Secondary | ICD-10-CM | POA: Diagnosis not present

## 2018-02-24 DIAGNOSIS — Z0000 Encounter for general adult medical examination without abnormal findings: Secondary | ICD-10-CM | POA: Diagnosis not present

## 2018-02-24 LAB — LIPID PANEL
Cholesterol: 161 mg/dL (ref 0–200)
HDL: 50.3 mg/dL (ref >39.00)
LDL Cholesterol: 87 mg/dL (ref 0–99)
NonHDL: 111.16
Total CHOL/HDL Ratio: 3
Triglycerides: 122 mg/dL (ref 0.0–149.0)
VLDL: 24.4 mg/dL (ref 0.0–40.0)

## 2018-02-24 LAB — COMPREHENSIVE METABOLIC PANEL
ALT: 16 U/L (ref 0–53)
AST: 14 U/L (ref 0–37)
Albumin: 4.3 g/dL (ref 3.5–5.2)
Alkaline Phosphatase: 55 U/L (ref 39–117)
BUN: 21 mg/dL (ref 6–23)
CO2: 29 mEq/L (ref 19–32)
Calcium: 9.4 mg/dL (ref 8.4–10.5)
Chloride: 104 mEq/L (ref 96–112)
Creatinine, Ser: 1.23 mg/dL (ref 0.40–1.50)
GFR: 58.88 mL/min — ABNORMAL LOW (ref >60.00)
Glucose, Bld: 105 mg/dL — ABNORMAL HIGH (ref 70–99)
Potassium: 4.7 mEq/L (ref 3.5–5.1)
Sodium: 140 mEq/L (ref 135–145)
Total Bilirubin: 1.3 mg/dL — ABNORMAL HIGH (ref 0.2–1.2)
Total Protein: 6.8 g/dL (ref 6.0–8.3)

## 2018-02-24 LAB — CBC WITH DIFFERENTIAL/PLATELET
BASOS ABS: 0 10*3/uL (ref 0.0–0.1)
Basophils Relative: 0.5 % (ref 0.0–3.0)
Eosinophils Absolute: 0.1 10*3/uL (ref 0.0–0.7)
Eosinophils Relative: 3 % (ref 0.0–5.0)
HCT: 45 % (ref 39.0–52.0)
Hemoglobin: 15.7 g/dL (ref 13.0–17.0)
Lymphocytes Relative: 30.9 % (ref 12.0–46.0)
Lymphs Abs: 1.4 10*3/uL (ref 0.7–4.0)
MCHC: 34.9 g/dL (ref 30.0–36.0)
MCV: 91.9 fl (ref 78.0–100.0)
Monocytes Absolute: 0.4 10*3/uL (ref 0.1–1.0)
Monocytes Relative: 10.1 % (ref 3.0–12.0)
Neutro Abs: 2.4 10*3/uL (ref 1.4–7.7)
Neutrophils Relative %: 55.5 % (ref 43.0–77.0)
Platelets: 234 10*3/uL (ref 150.0–400.0)
RBC: 4.9 Mil/uL (ref 4.22–5.81)
RDW: 12.4 % (ref 11.5–15.5)
WBC: 4.4 10*3/uL (ref 4.0–10.5)

## 2018-02-24 LAB — TSH: TSH: 4.96 u[IU]/mL — ABNORMAL HIGH (ref 0.35–4.50)

## 2018-02-24 LAB — VITAMIN D 25 HYDROXY (VIT D DEFICIENCY, FRACTURES): VITD: 21.15 ng/mL — ABNORMAL LOW (ref 30.00–100.00)

## 2018-02-24 LAB — HEMOGLOBIN A1C: Hgb A1c MFr Bld: 5.3 % (ref 4.6–6.5)

## 2018-02-25 LAB — HEPATITIS C ANTIBODY
HEP C AB: NONREACTIVE
SIGNAL TO CUT-OFF: 0.23 (ref <1.00)

## 2018-02-26 ENCOUNTER — Encounter: Payer: Self-pay | Admitting: *Deleted

## 2018-02-26 DIAGNOSIS — R079 Chest pain, unspecified: Secondary | ICD-10-CM | POA: Insufficient documentation

## 2018-02-27 NOTE — Progress Notes (Signed)
Cardiology Office Note:    Date:  02/28/2018   ID:  Jacob Ray, DOB [birth year], MRN [id number]  PCP:  Colon Branch, MD  Cardiologist:  Shirlee More, MD   Referring MD: Colon Branch, MD  ASSESSMENT:    1. Chest pain in adult   2. Chest pain, unspecified type   3. Barrett's esophagus with dysplasia    PLAN:    In order of problems listed above:  1. He is having exertional symptoms consistent with angina and a stable chronic pattern.  We discussed modalities for evaluation and the benefits and options of traditional stress testing or cardiac CTA and after discussion he elects to undergo CTA be scheduled as an outpatient.  To give [place] a precise diagnosis and allow [place] to judge if he requires medical therapy and a referral for revascularization.  He has no dye allergy and has normal renal function 2. Stable presently asymptomatic continue treatment per GI  Next appointment 6 weeks   Medication Adjustments/Labs and Tests Ordered: Current medicines are reviewed at length with the patient today.  Concerns regarding medicines are outlined above.  Orders Placed This Encounter  Procedures  . CT CORONARY MORPH W/CTA COR W/SCORE W/CA W/CM &/OR WO/CM  . CT CORONARY FRACTIONAL FLOW RESERVE DATA PREP  . CT CORONARY FRACTIONAL FLOW RESERVE FLUID ANALYSIS   No orders of the defined types were placed in this encounter.    No chief complaint on file.   History of Present Illness:    Jacob Ray is a 68 y.o. male with Barrett's esophagus and mild MR who is being seen today for the evaluation of chest pain at the request of Colon Branch, MD.  EKG 02/21/18 with [place] [facility] possible old ASMI personally reviewed  Recently his brother who underwent aortic valve replacement and bypass surgery.  Is caused him to be aware of his personal risk.  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  He relates about 5 years ago had a stress echo performed that was normal he has no known history of valvular heart disease himself.  His lipid profile is favorable.  Past Medical History:  Diagnosis Date  . Barrett's esophagus    EGD on 05/04/2013, next due in 5/18, [facility]  . BCC (basal cell carcinoma), arm 03/2013   Right forearm  . Blepharitis, right eye 2013  . BPH (benign prostatic hyperplasia)   . GERD (gastroesophageal reflux disease)   . Heart murmur   . History of Helicobacter pylori infection 05/2013  . Mitral regurgitation 2009   mild per ECHO  . Shingles 2013  . Varicose veins    bilateral    Past Surgical History:  Procedure Laterality Date  . SPIROMETRY  03/18/2013   [facility]  . TONSILLECTOMY    . WISDOM TOOTH EXTRACTION  Current Medications: Current Meds  Medication Sig  . pantoprazole (PROTONIX) 20 MG tablet Take 1 tablet (20 mg total) by mouth daily.     Allergies:   Patient has no known allergies.   Social History   Socioeconomic History  . Marital status: Married    Spouse name: Not on file  . Number of children: 1  . Years of education: Not on file  . Highest education level: Not on file  Occupational History  . Occupation: Press photographer , works full time   Social Needs  . Financial resource strain: Not on file  . Food insecurity:    Worry: Not on file    Inability: Not on file  . Transportation needs:    Medical: Not on file    Non-medical: Not on  file  Tobacco Use  . Smoking status: Never Smoker  . Smokeless tobacco: Current User    Types: Chew  Substance and Sexual Activity  . Alcohol use: Yes    Alcohol/week: 3.0 - 5.0 standard drinks    Types: 3 - 5 Cans of beer per week    Comment: 2 times week   . Drug use: No  . Sexual activity: Not on file  Lifestyle  . Physical activity:    Days per week: Not on file    Minutes per session: Not on file  . Stress: Not on file  Relationships  . Social connections:    Talks on phone: Not on file    Gets together: Not on file    Attends religious service: Not on file    Active member of club or organization: Not on file    Attends meetings of clubs or organizations: Not on file    Relationship status: Not on file  Other Topics Concern  . Not on file  Social History Narrative   Household-- pt and wife   Adult child      Family History: The patient's family history includes CAD in his brother; CAD (age of onset: 90) in his father; Diabetes in his brother; Hypertension in his father; Obesity in his brother; Prostate cancer (age of onset: 59) in his father; Valvular heart disease in his brother and father. There is no history of Colon cancer.  ROS:   Review of Systems  Constitution: Negative.  HENT: Negative.   Eyes: Negative.   Cardiovascular: Positive for chest pain and dyspnea on exertion.  Respiratory: Positive for shortness of breath.   Endocrine: Negative.   Hematologic/Lymphatic: Negative.   Skin: Negative.   Musculoskeletal: Negative.   Gastrointestinal: Negative.   Genitourinary: Negative.   Neurological: Negative.   Psychiatric/Behavioral: Negative.   Allergic/Immunologic: Negative.    Please see the history of present illness.     All other systems reviewed and are negative.  EKGs/Labs/Other Studies Reviewed:    The following studies were reviewed today:  Recent Labs: 02/24/2018: ALT 16; BUN 21; Creatinine, Ser 1.23; Hemoglobin 15.7; Platelets 234.0;  Potassium 4.7; Sodium 140; TSH 4.96  Recent Lipid Panel    Component Value Date/Time   CHOL 161 02/24/2018 0751   TRIG 122.0 02/24/2018 0751   HDL 50.30 02/24/2018 0751   CHOLHDL 3 02/24/2018 0751   VLDL 24.4 02/24/2018 0751   LDLCALC 87 02/24/2018 0751   LDLDIRECT 93.0 11/15/2014 0751    Physical Exam:    VS:  BP 106/84 (BP Location: Left Arm, Patient Position: Sitting, Cuff Size: Large)   Pulse 77   Ht 6\' 2"  (1.88 m)  Wt 235 lb (106.6 kg)   SpO2 96%   BMI 30.17 kg/m     Wt Readings from Last 3 Encounters:  02/28/18 235 lb (106.6 kg)  02/21/18 237 lb 6 oz (107.7 kg)  02/04/17 238 lb 4 oz (108.1 kg)     GEN:  Well nourished, well developed in no acute distress HEENT: Normal NECK: No JVD; No carotid bruits LYMPHATICS: No lymphadenopathy CARDIAC: RRR, no murmurs, rubs, gallops RESPIRATORY:  Clear to auscultation without rales, wheezing or rhonchi  ABDOMEN: Soft, non-tender, non-distended MUSCULOSKELETAL:  No edema; No deformity  SKIN: Warm and dry NEUROLOGIC:  Alert and oriented x 3 PSYCHIATRIC:  Normal affect     Signed, Shirlee More, MD  02/28/2018 9:48 AM    [place]

## 2018-02-28 ENCOUNTER — Encounter: Payer: Self-pay | Admitting: Cardiology

## 2018-02-28 ENCOUNTER — Ambulatory Visit (INDEPENDENT_AMBULATORY_CARE_PROVIDER_SITE_OTHER): Payer: BLUE CROSS/BLUE SHIELD | Admitting: Cardiology

## 2018-02-28 VITALS — BP 106/84 | HR 77 | Ht 74.0 in | Wt 235.0 lb

## 2018-02-28 DIAGNOSIS — K22719 Barrett's esophagus with dysplasia, unspecified: Secondary | ICD-10-CM

## 2018-02-28 DIAGNOSIS — R079 Chest pain, unspecified: Secondary | ICD-10-CM | POA: Diagnosis not present

## 2018-02-28 MED ORDER — METOPROLOL TARTRATE 50 MG PO TABS: ORAL_TABLET | ORAL | 0 refills | Status: DC

## 2018-02-28 MED ORDER — NITROGLYCERIN 0.4 MG SL SUBL: 0.4000 mg | SUBLINGUAL_TABLET | SUBLINGUAL | 1 refills | Status: AC | PRN

## 2018-02-28 MED ORDER — VITAMIN D (ERGOCALCIFEROL) 1.25 MG (50000 UNIT) PO CAPS: 50000.0000 [IU] | ORAL_CAPSULE | ORAL | 0 refills | Status: DC

## 2018-02-28 MED ORDER — ASPIRIN EC 81 MG PO TBEC: 81.0000 mg | DELAYED_RELEASE_TABLET | Freq: Every day | ORAL | Status: DC

## 2018-02-28 NOTE — Addendum Note (Signed)
Addended by: Beckey Rutter on: 02/28/2018 10:07 AM   Modules accepted: Orders

## 2018-02-28 NOTE — Addendum Note (Signed)
Addended by: Austin Miles on: 02/28/2018 10:16 AM   Modules accepted: Orders

## 2018-02-28 NOTE — Addendum Note (Signed)
Addended byDamita Dunnings D on: 02/28/2018 08:08 AM   Modules accepted: Orders

## 2018-02-28 NOTE — Patient Instructions (Addendum)
Medication Instructions:  Your physician recommends that you continue on your current medications as directed. Please refer to the Current Medication list given to you today.  If you need a refill on your cardiac medications before your next appointment, please call your pharmacy.   Lab work: NONE  Testing/Procedures: Your physician has requested that you have cardiac CT. Cardiac computed tomography (CT) is a painless test that uses an x-ray machine to take clear, detailed pictures of your heart. For further information please visit [employer]uge[employer]iest[employer]tn. Please follow instruction sheet as given.  Please arrive at the [facility] main entrance of [facility] at xx:xx AM (30-45 minutes prior to test start time)  [facility] [place], [place] [address] [phone]  Proceed to the [facility] Radiology Department (First Floor).  Please follow these instructions carefully (unless otherwise directed):  Hold all erectile dysfunction medications at least 48 hours prior to test.  On the Night Before the Test: . Be sure to Drink plenty of water. . Do not consume any caffeinated/decaffeinated beverages or chocolate 12 hours prior to your test. . Do not take any antihistamines 12 hours prior to your test. . If the patient has contrast allergy:  On the Day of the Test: . Drink plenty of water. Do not drink any water within one hour of the test. . Do not eat any food 4 hours prior to the test. . You may take your regular medications prior to the test.  . Take metoprolol (Lopressor) two hours prior to test.        -If HR is less than 55 BPM- No Beta Blocker                -IF HR is greater than 55 BPM and patient is less than or equal to 36 yrs old Lopressor 100mg  x1      After the Test: . Drink plenty of water. . After receiving IV contrast, you may experience a mild flushed feeling. This is normal. . On occasion, you may experience a mild rash up to 24  hours after the test. This is not dangerous. If this occurs, you can take Benadryl 25 mg and increase your fluid intake. . If you experience trouble breathing, this can be serious. If it is severe call 911 IMMEDIATELY. If it is mild, please call our office.  .           Follow-Up: At [facility], you and your health needs are our priority.  As part of our continuing mission to provide you with exceptional heart care, we have created designated Provider Care Teams.  These Care Teams include your primary Cardiologist (physician) and Advanced Practice Providers (APPs -  Physician Assistants and Nurse Practitioners) who all work together to provide you with the care you need, when you need it. You will need a follow up appointment in 6 weeks.     Any Other Special Instructions Will Be Listed Below   Cardiac CT Angiogram  A cardiac CT angiogram is a procedure to look at the heart and the area around the heart. It may be done to help find the cause of chest pains or other symptoms of heart disease. During this procedure, a large X-ray machine, called a CT scanner, takes detailed pictures of the heart and the surrounding area after a dye (contrast material) has been injected into blood vessels in the area. The procedure is also sometimes called a coronary CT angiogram, coronary artery  scanning, or CTA. A cardiac CT angiogram allows the health care provider to see how well blood is flowing to and from the heart. The health care provider will be able to see if there are any problems, such as:  Blockage or narrowing of the coronary arteries in the heart.  Fluid around the heart.  Signs of weakness or disease in the muscles, valves, and tissues of the heart. Tell a health care provider about:  Any allergies you have. This is especially important if you have had a previous allergic reaction to contrast dye.  All medicines you are taking, including vitamins, herbs, eye drops, creams, and  over-the-counter medicines.  Any blood disorders you have.  Any surgeries you have had.  Any medical conditions you have.  Whether you are pregnant or may be pregnant.  Any anxiety disorders, chronic pain, or other conditions you have that may increase your stress or prevent you from lying still. What are the risks? Generally, this is a safe procedure. However, problems may occur, including:  Bleeding.  Infection.  Allergic reactions to medicines or dyes.  Damage to other structures or organs.  Kidney damage from the dye or contrast that is used.  Increased risk of cancer from radiation exposure. This risk is low. Talk with your health care provider about: ? The risks and benefits of testing. ? How you can receive the lowest dose of radiation. What happens before the procedure?  Wear comfortable clothing and remove any jewelry, glasses, dentures, and hearing aids.  Follow instructions from your health care provider about eating and drinking. This may include: ? For 12 hours before the test - avoid caffeine. This includes tea, coffee, soda, energy drinks, and diet pills. Drink plenty of water or other fluids that do not have caffeine in them. Being well-hydrated can prevent complications. ? For 4-6 hours before the test - stop eating and drinking. The contrast dye can cause nausea, but this is less likely if your stomach is empty.  Ask your health care provider about changing or stopping your regular medicines. This is especially important if you are taking diabetes medicines, blood thinners, or medicines to treat erectile dysfunction. What happens during the procedure?  Hair on your chest may need to be removed so that small sticky patches called electrodes can be placed on your chest. These will transmit information that helps to monitor your heart during the test.  An IV tube will be inserted into one of your veins.  You might be given a medicine to control your heart rate  during the test. This will help to ensure that good images are obtained.  You will be asked to lie on an exam table. This table will slide in and out of the CT machine during the procedure.  Contrast dye will be injected into the IV tube. You might feel warm, or you may get a metallic taste in your mouth.  You will be given a medicine (nitroglycerin) to relax (dilate) the arteries in your heart.  The table that you are lying on will move into the CT machine tunnel for the scan.  The person running the machine will give you instructions while the scans are being done. You may be asked to: ? Keep your arms above your head. ? Hold your breath. ? Stay very still, even if the table is moving.  When the scanning is complete, you will be moved out of the machine.  The IV tube will be removed. The procedure  may vary among health care providers and hospitals. What happens after the procedure?  You might feel warm, or you may get a metallic taste in your mouth from the contrast dye.  You may have a headache from the nitroglycerin.  After the procedure, drink water or other fluids to wash (flush) the contrast material out of your body.  Contact a health care provider if you have any symptoms of allergy to the contrast. These symptoms include: ? Shortness of breath. ? Rash or hives. ? A racing heartbeat.  Most people can return to their normal activities right after the procedure. Ask your health care provider what activities are safe for you.  It is up to you to get the results of your procedure. Ask your health care provider, or the department that is doing the procedure, when your results will be ready. Summary  A cardiac CT angiogram is a procedure to look at the heart and the area around the heart. It may be done to help find the cause of chest pains or other symptoms of heart disease.  During this procedure, a large X-ray machine, called a CT scanner, takes detailed pictures of the  heart and the surrounding area after a dye (contrast material) has been injected into blood vessels in the area.  Ask your health care provider about changing or stopping your regular medicines before the procedure. This is especially important if you are taking diabetes medicines, blood thinners, or medicines to treat erectile dysfunction.  After the procedure, drink water or other fluids to wash (flush) the contrast material out of your body. This information is not intended to replace advice given to you by your health care provider. Make sure you discuss any questions you have with your health care provider. Document Released: 12/01/2007 Document Revised: 11/07/2015 Document Reviewed: 11/07/2015 Elsevier Interactive Patient Education  2019 [employer].

## 2018-03-26 DIAGNOSIS — L570 Actinic keratosis: Secondary | ICD-10-CM | POA: Diagnosis not present

## 2018-03-26 DIAGNOSIS — L814 Other melanin hyperpigmentation: Secondary | ICD-10-CM | POA: Diagnosis not present

## 2018-03-26 DIAGNOSIS — L821 Other seborrheic keratosis: Secondary | ICD-10-CM | POA: Diagnosis not present

## 2018-03-26 DIAGNOSIS — Z08 Encounter for follow-up examination after completed treatment for malignant neoplasm: Secondary | ICD-10-CM | POA: Diagnosis not present

## 2018-03-26 DIAGNOSIS — Z85828 Personal history of other malignant neoplasm of skin: Secondary | ICD-10-CM | POA: Diagnosis not present

## 2018-04-08 ENCOUNTER — Telehealth: Payer: Self-pay | Admitting: Cardiology

## 2018-04-08 NOTE — Telephone Encounter (Signed)
° °  Primary Cardiologist:   Hospital  Patient contacted.  History reviewed.  No symptoms to suggest any unstable cardiac conditions.  Based on discussion, with current pandemic situation, we will be postponing this appointment for Jacob Ray with a plan for f/u in 6-12 wks or sooner if feasible/necessary.  If symptoms change, he has been instructed to contact our office.   Calla Kicks  04/08/2018 2:11 PM         .

## 2018-04-08 NOTE — Telephone Encounter (Signed)
2-4/10 °

## 2018-04-11 ENCOUNTER — Ambulatory Visit: Payer: BLUE CROSS/BLUE SHIELD | Admitting: Cardiology

## 2018-04-17 NOTE — Telephone Encounter (Signed)
LAM for patient to call back

## 2018-05-23 ENCOUNTER — Other Ambulatory Visit: Payer: Self-pay | Admitting: Gastroenterology

## 2018-05-29 ENCOUNTER — Telehealth: Payer: Self-pay | Admitting: Cardiology

## 2018-05-29 DIAGNOSIS — R079 Chest pain, unspecified: Secondary | ICD-10-CM

## 2018-05-29 DIAGNOSIS — Z01812 Encounter for preprocedural laboratory examination: Secondary | ICD-10-CM

## 2018-05-29 NOTE — Telephone Encounter (Signed)
Left message to call and schedule ct.

## 2018-05-29 NOTE — Telephone Encounter (Signed)
Patient is scheduled for cardiac CTA on 06-05-2018.  Patient will come to office to have BMP prior to procedure.  Patient aware.

## 2018-05-30 DIAGNOSIS — Z01812 Encounter for preprocedural laboratory examination: Secondary | ICD-10-CM | POA: Diagnosis not present

## 2018-05-30 DIAGNOSIS — R079 Chest pain, unspecified: Secondary | ICD-10-CM | POA: Diagnosis not present

## 2018-05-31 LAB — BASIC METABOLIC PANEL
BUN/Creatinine Ratio: 13 (ref 10–24)
BUN: 15 mg/dL (ref 8–27)
CO2: 24 mmol/L (ref 20–29)
Calcium: 9.3 mg/dL (ref 8.6–10.2)
Chloride: 104 mmol/L (ref 96–106)
Creatinine, Ser: 1.12 mg/dL (ref 0.76–1.27)
GFR calc Af Amer: 79 mL/min/{1.73_m2} (ref >59)
GFR calc non Af Amer: 68 mL/min/{1.73_m2} (ref >59)
Glucose: 101 mg/dL — ABNORMAL HIGH (ref 65–99)
Potassium: 4.9 mmol/L (ref 3.5–5.2)
Sodium: 141 mmol/L (ref 134–144)

## 2018-06-04 ENCOUNTER — Telehealth (HOSPITAL_COMMUNITY): Payer: Self-pay | Admitting: Emergency Medicine

## 2018-06-04 NOTE — Telephone Encounter (Signed)
Reaching out to patient to offer assistance regarding upcoming cardiac imaging study; pt verbalizes understanding of appt date/time, parking situation and where to check in, pre-test NPO status and medications ordered, and verified current allergies; name and call back number provided for further questions should they arise Jakoby Melendrez RN Navigator Cardiac Imaging [facility] [phone] office [phone] cell  Pt denies covid symptoms, verbalized understanding of visitor policy. 

## 2018-06-05 ENCOUNTER — Ambulatory Visit (HOSPITAL_COMMUNITY)
Admission: RE | Admit: 2018-06-05 | Discharge: 2018-06-05 | Disposition: A | Discharge status: Home / Self Care | Payer: BLUE CROSS/BLUE SHIELD | Source: Ambulatory Visit | Attending: Cardiology | Admitting: Cardiology

## 2018-06-05 ENCOUNTER — Other Ambulatory Visit: Payer: Self-pay

## 2018-06-05 DIAGNOSIS — R079 Chest pain, unspecified: Secondary | ICD-10-CM

## 2018-06-05 MED ORDER — NITROGLYCERIN 0.4 MG SL SUBL
0.8000 mg | SUBLINGUAL_TABLET | Freq: Once | SUBLINGUAL | Status: AC
  Administered 2018-06-05: 08:00:00 0.8 mg via SUBLINGUAL
  Filled 2018-06-05: qty 25

## 2018-06-05 MED ORDER — IOHEXOL 350 MG/ML SOLN
80.0000 mL | Freq: Once | INTRAVENOUS | Status: AC | PRN
  Administered 2018-06-05: 09:00:00 80 mL via INTRAVENOUS

## 2018-06-05 MED ORDER — NITROGLYCERIN 0.4 MG SL SUBL
SUBLINGUAL_TABLET | SUBLINGUAL | Status: AC
  Filled 2018-06-05: qty 2

## 2018-06-05 NOTE — Progress Notes (Signed)
CT complete. Patient denies any complaints. Offered patient snack and beverage.

## 2018-06-05 NOTE — Progress Notes (Signed)
Patient ambulatory out of department with steady gait noted. Denies any complaints.  

## 2018-06-06 ENCOUNTER — Telehealth: Payer: Self-pay

## 2018-06-06 ENCOUNTER — Telehealth: Payer: Self-pay | Admitting: *Deleted

## 2018-06-06 NOTE — Telephone Encounter (Signed)
YOUR CARDIOLOGY TEAM HAS ARRANGED FOR AN E-VISIT FOR YOUR APPOINTMENT - PLEASE REVIEW IMPORTANT INFORMATION BELOW SEVERAL DAYS PRIOR TO YOUR APPOINTMENT  Due to the recent COVID-19 pandemic, we are transitioning in-person office visits to tele-medicine visits in an effort to decrease unnecessary exposure to our patients, their families, and staff. These visits are billed to your insurance just like a normal visit is. We also encourage you to sign up for MyChart if you have not already done so. You will need a smartphone if possible. For patients that do not have this, we can still complete the visit using a regular telephone but do prefer a smartphone to enable video when possible. You may have a family member that lives with you that can help. If possible, we also ask that you have a blood pressure cuff and scale at home to measure your blood pressure, heart rate and weight prior to your scheduled appointment. Patients with clinical needs that need an in-person evaluation and testing will still be able to come to the office if absolutely necessary. If you have any questions, feel free to call our office.     YOUR PROVIDER WILL BE USING THE FOLLOWING PLATFORM TO COMPLETE YOUR VISIT: *DoxyMe . IF USING MYCHART - How to Download the MyChart App to Your SmartPhone   - If Apple, go to [employer] and type in MyChart in the search bar and download the app. If Android, ask patient to go to [employer] and type in [place] in the search bar and download the app. The app is free but as with any other app downloads, your phone may require you to verify saved payment information or Apple/Android password.  - You will need to then log into the app with your MyChart username and password, and select [facility] as your healthcare provider to link the account.  - When it is time for your visit, go to the MyChart app, find appointments, and click Begin Video Visit. Be sure to Select Allow for your device to access  the Microphone and Camera for your visit. You will then be connected, and your provider will be with you shortly.  **If you have any issues connecting or need assistance, please contact MyChart service desk [phone])**  **If using a computer, in order to ensure the best quality for your visit, you will need to use either of the following Internet Browsers: Insurance underwriter or [employer]**  . IF USING DOXIMITY or DOXY.ME - The staff will give you instructions on receiving your link to join the meeting the day of your visit.      2-3 DAYS BEFORE YOUR APPOINTMENT  You will receive a telephone call from one of our [facility] team members - your caller ID may say "Unknown caller." If this is a video visit, we will walk you through how to get the video launched on your phone. We will remind you check your blood pressure, heart rate and weight prior to your scheduled appointment. If you have an Apple Watch or Kardia, please upload any pertinent ECG strips the day before or morning of your appointment to [facility]. Our staff will also make sure you have reviewed the consent and agree to move forward with your scheduled tele-health visit.     THE DAY OF YOUR APPOINTMENT  Approximately 15 minutes prior to your scheduled appointment, you will receive a telephone call from one of [facility] team - your caller ID may say "Unknown caller."  Our  staff will confirm medications, vital signs for the day and any symptoms you may be experiencing. Please have this information available prior to the time of visit start. It may also be helpful for you to have a pad of paper and pen handy for any instructions given during your visit. They will also walk you through joining the smartphone meeting if this is a video visit.    CONSENT FOR TELE-HEALTH VISIT - PLEASE REVIEW  I hereby voluntarily request, consent and authorize [facility] and its employed or contracted physicians, physician assistants,  nurse practitioners or other licensed health care professionals (the Practitioner), to provide me with telemedicine health care services (the "Services") as deemed necessary by the treating Practitioner. I acknowledge and consent to receive the Services by the Practitioner via telemedicine. I understand that the telemedicine visit will involve communicating with the Practitioner through live audiovisual communication technology and the disclosure of certain medical information by electronic transmission. I acknowledge that I have been given the opportunity to request an in-person assessment or other available alternative prior to the telemedicine visit and am voluntarily participating in the telemedicine visit.  I understand that I have the right to withhold or withdraw my consent to the use of telemedicine in the course of my care at any time, without affecting my right to future care or treatment, and that the Practitioner or I may terminate the telemedicine visit at any time. I understand that I have the right to inspect all information obtained and/or recorded in the course of the telemedicine visit and may receive copies of available information for a reasonable fee.  I understand that some of the potential risks of receiving the Services via telemedicine include:  Marland Kitchen Delay or interruption in medical evaluation due to technological equipment failure or disruption; . Information transmitted may not be sufficient (e.g. poor resolution of images) to allow for appropriate medical decision making by the Practitioner; and/or  . In rare instances, security protocols could fail, causing a breach of personal health information.  Furthermore, I acknowledge that it is my responsibility to provide information about my medical history, conditions and care that is complete and accurate to the best of my ability. I acknowledge that Practitioner's advice, recommendations, and/or decision may be based on factors not  within their control, such as incomplete or inaccurate data provided by me or distortions of diagnostic images or specimens that may result from electronic transmissions. I understand that the practice of medicine is not an exact science and that Practitioner makes no warranties or guarantees regarding treatment outcomes. I acknowledge that I will receive a copy of this consent concurrently upon execution via email to the email address I last provided but may also request a printed copy by calling the office of [facility].    I understand that my insurance will be billed for this visit.   I have read or had this consent read to me. . I understand the contents of this consent, which adequately explains the benefits and risks of the Services being provided via telemedicine.  . I have been provided ample opportunity to ask questions regarding this consent and the Services and have had my questions answered to my satisfaction. . I give my informed consent for the services to be provided through the use of telemedicine in my medical care  By participating in this telemedicine visit I agree to the above.  Patient gave verbal consent to virtual visit with Dr Bettina Gavia on 06-20-18.

## 2018-06-06 NOTE — Telephone Encounter (Signed)
-----   Message from Richardo Priest, MD sent at 06/06/2018  1:26 PM EDT ----- Normal or stable result  Mild atherosclerosis in one artery, we will send for flow analysis and can discuss at FU

## 2018-06-06 NOTE — Telephone Encounter (Signed)
Left message for patient to return call to discuss cardiac CTA results. Patient will need to be scheduled for a follow up appointment as well.

## 2018-06-19 NOTE — H&P (View-Only) (Signed)
Cardiology Office Note:    Date:  06/20/2018   ID:  Jacob Ray, DOB 07/03/1950, MRN [id number]  PCP:  Paz, Jose E, MD  Cardiologist:  Brian Munley, MD    Referring MD: Paz, Jose E, MD    ASSESSMENT:    1. Coronary artery disease of native artery of native heart with stable angina pectoris (HCC)    PLAN:    In order of problems listed above:  1. He is a high risk cardiac CTA he has had chest pain after review of options benefits and risk elects to undergo coronary angiography and appropriate PCI and stenting approximately be stenosis.  Continue low-dose aspirin initiate a low-dose of a low intensity statin and to be scheduled in the next 2 weeks.   Next appointment: 4 weeks   Medication Adjustments/Labs and Tests Ordered: Current medicines are reviewed at length with the patient today.  Concerns regarding medicines are outlined above.  No orders of the defined types were placed in this encounter.  No orders of the defined types were placed in this encounter.   Chief complaint abnormal cardiac CTA  History of Present Illness:    Jacob Ray is a 68 y.o. male with a hx of chest pain with recent abnormal cardiac CTA last seen 02/28/2018.  His calcium score was quite low 4.6 the initial review suggested nonobstructive proximal LAD stenosis, however fractional flow reserve was decreased with a final conclusion of hemodynamically significant stenosis in the proximal left anterior descending coronary artery FFR 0.67-0.72.  Compliance with diet, lifestyle and medications: Yes  He is seen in the office follow-up to his cardiac CTA.  Although coronary calcium score was quite low and I initial review.  To have mild proximal LAD disease fractional flow reserve was diminished official report says he is hemodynamically significant severe stenosis and I met with the patient and with his wife by phone to discuss the findings his anatomy is high risk despite a paucity of symptoms I  think he should undergo coronary angiography.  Risk benefits and options were detailed I have asked him to start lipid-lowering therapy with a low intensity statin as his baseline LDL was 86 take aspirin daily and told in my opinion he will have confirmation a cardiac cath and if flow-limiting would benefit from PCI with high risk anatomy.  He has had no chest pain since his last visit edema shortness of breath palpitation or syncope has no renal dysfunction or contrast dye allergy. Past Medical History:  Diagnosis Date  . Barrett's esophagus    EGD on 05/04/2013, next due in 5/18, [facility]  . BCC (basal cell carcinoma), arm 03/2013   Right forearm  . Blepharitis, right eye 2013  . BPH (benign prostatic hyperplasia)   . GERD (gastroesophageal reflux disease)   . Heart murmur   . History of Helicobacter pylori infection 05/2013  . Mitral regurgitation 2009   mild per ECHO  . Shingles 2013  . Varicose veins    bilateral    Past Surgical History:  Procedure Laterality Date  . SPIROMETRY  03/18/2013   [facility]  . TONSILLECTOMY    . WISDOM TOOTH EXTRACTION      Current Medications: Current Meds  Medication Sig  . nitroGLYCERIN (NITROSTAT) 0.4 MG SL tablet Place 1 tablet (0.4 mg total) under the tongue every 5 (five) minutes x 3 doses as needed for chest pain.  . pantoprazole (PROTONIX) 20 MG tablet Take 1 tablet (20 mg   total) by mouth daily.  . Vitamin D, Ergocalciferol, (DRISDOL) 1.25 MG (50000 UT) CAPS capsule Take 1 capsule (50,000 Units total) by mouth every 7 (seven) days. For 12 weeks     Allergies:   Patient has no known allergies.   Social History   Socioeconomic History  . Marital status: Married    Spouse name: Not on file  . Number of children: 1  . Years of education: Not on file  . Highest education level: Not on file  Occupational History  . Occupation: sales , works full time   Social Needs  . Financial resource strain: Not on file  . Food  insecurity    Worry: Not on file    Inability: Not on file  . Transportation needs    Medical: Not on file    Non-medical: Not on file  Tobacco Use  . Smoking status: Never Smoker  . Smokeless tobacco: Current User    Types: Chew  Substance and Sexual Activity  . Alcohol use: Yes    Alcohol/week: 3.0 - 5.0 standard drinks    Types: 3 - 5 Cans of beer per week    Comment: 2 times week   . Drug use: No  . Sexual activity: Not on file  Lifestyle  . Physical activity    Days per week: Not on file    Minutes per session: Not on file  . Stress: Not on file  Relationships  . Social connections    Talks on phone: Not on file    Gets together: Not on file    Attends religious service: Not on file    Active member of club or organization: Not on file    Attends meetings of clubs or organizations: Not on file    Relationship status: Not on file  Other Topics Concern  . Not on file  Social History Narrative   Household-- pt and wife   Adult child      Family History: The patient's family history includes CAD in his brother; CAD (age of onset: 70) in his father; Diabetes in his brother; Hypertension in his father; Obesity in his brother; Prostate cancer (age of onset: 75) in his father; Valvular heart disease in his brother and father. There is no history of Colon cancer. ROS:   Please see the history of present illness.    All other systems reviewed and are negative.  EKGs/Labs/Other Studies Reviewed:    The following studies were reviewed today:    Recent Labs: 02/24/2018: ALT 16; Hemoglobin 15.7; Platelets 234.0; TSH 4.96 05/30/2018: BUN 15; Creatinine, Ser 1.12; Potassium 4.9; Sodium 141  Recent Lipid Panel    Component Value Date/Time   CHOL 161 02/24/2018 0751   TRIG 122.0 02/24/2018 0751   HDL 50.30 02/24/2018 0751   CHOLHDL 3 02/24/2018 0751   VLDL 24.4 02/24/2018 0751   LDLCALC 87 02/24/2018 0751   LDLDIRECT 93.0 11/15/2014 0751    Physical Exam:    VS:  BP  114/86 (BP Location: Right Arm, Patient Position: Sitting, Cuff Size: Large)   Pulse 68   Temp (!) 96.6 F (35.9 C)   Ht 6' 2" (1.88 m)   Wt 229 lb 12.8 oz (104.2 kg)   SpO2 97%   BMI 29.50 kg/m     Wt Readings from Last 3 Encounters:  06/20/18 229 lb 12.8 oz (104.2 kg)  02/28/18 235 lb (106.6 kg)  02/21/18 237 lb 6 oz (107.7 kg)     GEN:    Well nourished, well developed in no acute distress HEENT: Normal NECK: No JVD; No carotid bruits LYMPHATICS: No lymphadenopathy CARDIAC: RRR, no murmurs, rubs, gallops RESPIRATORY:  Clear to auscultation without rales, wheezing or rhonchi  ABDOMEN: Soft, non-tender, non-distended MUSCULOSKELETAL:  No edema; No deformity  SKIN: Warm and dry NEUROLOGIC:  Alert and oriented x 3 PSYCHIATRIC:  Normal affect    Signed, Brian Munley, MD  06/20/2018 9:04 AM    [facility]  

## 2018-06-19 NOTE — Progress Notes (Signed)
Cardiology Office Note:    Date:  06/20/2018   ID:  Jacob Ray, DOB November 20, 1950, MRN [id number]  PCP:  Colon Branch, MD  Cardiologist:  Shirlee More, MD    Referring MD: Colon Branch, MD    ASSESSMENT:    1. Coronary artery disease of native artery of native heart with stable angina pectoris ([place])    PLAN:    In order of problems listed above:  1. He is a high risk cardiac CTA he has had chest pain after review of options benefits and risk elects to undergo coronary angiography and appropriate PCI and stenting approximately be stenosis.  Continue low-dose aspirin initiate a low-dose of a low intensity statin and to be scheduled in the next 2 weeks.   Next appointment: 4 weeks   Medication Adjustments/Labs and Tests Ordered: Current medicines are reviewed at length with the patient today.  Concerns regarding medicines are outlined above.  No orders of the defined types were placed in this encounter.  No orders of the defined types were placed in this encounter.   Chief complaint abnormal cardiac CTA  History of Present Illness:    Jacob Ray is a 68 y.o. male with a hx of chest pain with recent abnormal cardiac CTA last seen 02/28/2018.  His calcium score was quite low 4.6 the initial review suggested nonobstructive proximal LAD stenosis, however fractional flow reserve was decreased with a final conclusion of hemodynamically significant stenosis in the proximal left anterior descending coronary artery FFR 0.67-0.72.  Compliance with diet, lifestyle and medications: Yes  He is seen in the office follow-up to his cardiac CTA.  Although coronary calcium score was quite low and I initial review.  To have mild proximal LAD disease fractional flow reserve was diminished official report says he is hemodynamically significant severe stenosis and I met with the patient and with his wife by phone to discuss the findings his anatomy is high risk despite a paucity of symptoms I  think he should undergo coronary angiography.  Risk benefits and options were detailed I have asked him to start lipid-lowering therapy with a low intensity statin as his baseline LDL was 86 take aspirin daily and told in my opinion he will have confirmation a cardiac cath and if flow-limiting would benefit from PCI with high risk anatomy.  He has had no chest pain since his last visit edema shortness of breath palpitation or syncope has no renal dysfunction or contrast dye allergy. Past Medical History:  Diagnosis Date  . Barrett's esophagus    EGD on 05/04/2013, next due in 5/18, [facility]  . BCC (basal cell carcinoma), arm 03/2013   Right forearm  . Blepharitis, right eye 2013  . BPH (benign prostatic hyperplasia)   . GERD (gastroesophageal reflux disease)   . Heart murmur   . History of Helicobacter pylori infection 05/2013  . Mitral regurgitation 2009   mild per ECHO  . Shingles 2013  . Varicose veins    bilateral    Past Surgical History:  Procedure Laterality Date  . SPIROMETRY  03/18/2013   [facility]  . TONSILLECTOMY    . WISDOM TOOTH EXTRACTION      Current Medications: Current Meds  Medication Sig  . nitroGLYCERIN (NITROSTAT) 0.4 MG SL tablet Place 1 tablet (0.4 mg total) under the tongue every 5 (five) minutes x 3 doses as needed for chest pain.  . pantoprazole (PROTONIX) 20 MG tablet Take 1 tablet (20 mg  total) by mouth daily.  . Vitamin D, Ergocalciferol, (DRISDOL) 1.25 MG (50000 UT) CAPS capsule Take 1 capsule (50,000 Units total) by mouth every 7 (seven) days. For 12 weeks     Allergies:   Patient has no known allergies.   Social History   Socioeconomic History  . Marital status: Married    Spouse name: Not on file  . Number of children: 1  . Years of education: Not on file  . Highest education level: Not on file  Occupational History  . Occupation: Press photographer , works full time   Social Needs  . Financial resource strain: Not on file  . Food  insecurity    Worry: Not on file    Inability: Not on file  . Transportation needs    Medical: Not on file    Non-medical: Not on file  Tobacco Use  . Smoking status: Never Smoker  . Smokeless tobacco: Current User    Types: Chew  Substance and Sexual Activity  . Alcohol use: Yes    Alcohol/week: 3.0 - 5.0 standard drinks    Types: 3 - 5 Cans of beer per week    Comment: 2 times week   . Drug use: No  . Sexual activity: Not on file  Lifestyle  . Physical activity    Days per week: Not on file    Minutes per session: Not on file  . Stress: Not on file  Relationships  . Social Herbalist on phone: Not on file    Gets together: Not on file    Attends religious service: Not on file    Active member of club or organization: Not on file    Attends meetings of clubs or organizations: Not on file    Relationship status: Not on file  Other Topics Concern  . Not on file  Social History Narrative   Household-- pt and wife   Adult child      Family History: The patient's family history includes CAD in his brother; CAD (age of onset: 59) in his father; Diabetes in his brother; Hypertension in his father; Obesity in his brother; Prostate cancer (age of onset: 88) in his father; Valvular heart disease in his brother and father. There is no history of Colon cancer. ROS:   Please see the history of present illness.    All other systems reviewed and are negative.  EKGs/Labs/Other Studies Reviewed:    The following studies were reviewed today:    Recent Labs: 02/24/2018: ALT 16; Hemoglobin 15.7; Platelets 234.0; TSH 4.96 05/30/2018: BUN 15; Creatinine, Ser 1.12; Potassium 4.9; Sodium 141  Recent Lipid Panel    Component Value Date/Time   CHOL 161 02/24/2018 0751   TRIG 122.0 02/24/2018 0751   HDL 50.30 02/24/2018 0751   CHOLHDL 3 02/24/2018 0751   VLDL 24.4 02/24/2018 0751   LDLCALC 87 02/24/2018 0751   LDLDIRECT 93.0 11/15/2014 0751    Physical Exam:    VS:  BP  114/86 (BP Location: Right Arm, Patient Position: Sitting, Cuff Size: Large)   Pulse 68   Temp (!) 96.6 F (35.9 C)   Ht _0  (1.88 m)   Wt 229 lb 12.8 oz (104.2 kg)   SpO2 97%   BMI 29.50 kg/m     Wt Readings from Last 3 Encounters:  06/20/18 229 lb 12.8 oz (104.2 kg)  02/28/18 235 lb (106.6 kg)  02/21/18 237 lb 6 oz (107.7 kg)     GEN:  Well nourished, well developed in no acute distress HEENT: Normal NECK: No JVD; No carotid bruits LYMPHATICS: No lymphadenopathy CARDIAC: RRR, no murmurs, rubs, gallops RESPIRATORY:  Clear to auscultation without rales, wheezing or rhonchi  ABDOMEN: Soft, non-tender, non-distended MUSCULOSKELETAL:  No edema; No deformity  SKIN: Warm and dry NEUROLOGIC:  Alert and oriented x 3 PSYCHIATRIC:  Normal affect    Signed, Shirlee More, MD  06/20/2018 9:04 AM    [place]

## 2018-06-20 ENCOUNTER — Encounter: Payer: Self-pay | Admitting: Cardiology

## 2018-06-20 ENCOUNTER — Other Ambulatory Visit: Payer: Self-pay

## 2018-06-20 ENCOUNTER — Ambulatory Visit (INDEPENDENT_AMBULATORY_CARE_PROVIDER_SITE_OTHER): Payer: BC Managed Care – PPO | Admitting: Cardiology

## 2018-06-20 VITALS — BP 114/86 | HR 68 | Temp 96.6°F | Ht 74.0 in | Wt 229.8 lb

## 2018-06-20 DIAGNOSIS — Z01812 Encounter for preprocedural laboratory examination: Secondary | ICD-10-CM

## 2018-06-20 DIAGNOSIS — I2510 Atherosclerotic heart disease of native coronary artery without angina pectoris: Secondary | ICD-10-CM | POA: Insufficient documentation

## 2018-06-20 DIAGNOSIS — I25118 Atherosclerotic heart disease of native coronary artery with other forms of angina pectoris: Secondary | ICD-10-CM | POA: Diagnosis not present

## 2018-06-20 NOTE — Patient Instructions (Signed)
Medication Instructions:  Your physician recommends that you continue on your current medications as directed. Please refer to the Current Medication list given to you today.  If you need a refill on your cardiac medications before your next appointment, please call your pharmacy.   Lab work: Your physician recommends that you return for lab work today: CBC, BMP.   If you have labs (blood work) drawn today and your tests are completely normal, you will receive your results only by: Marland Kitchen MyChart Message (if you have MyChart) OR . A paper copy in the mail If you have any lab test that is abnormal or we need to change your treatment, we will call you to review the results.  Testing/Procedures: You had an EKG today.    [facility] [place] [address] for COVID testing on Monday, 06/30/2018, at 9:55 am.     [address], [place] [address] Dept: [phone] Loc: [phone]  Jacob Ray  06/20/2018  You are scheduled for a Cardiac Catheterization on Thursday, 07/03/2018, with Dr. [name].  1. Please arrive at the [facility] (Main Entrance A) at [facility]: [address] [place], [place] [address] at 5:30 AM (This time is two hours before your procedure to ensure your preparation). Free valet parking service is available.   Special note: Every effort is made to have your procedure done on time. Please understand that emergencies sometimes delay scheduled procedures.  2. Diet: Do not eat solid foods after midnight.  The patient may have clear liquids until 5am upon the day of the procedure.  3. Labs: None needed.   4. Medication instructions in preparation for your procedure:   Contrast Allergy: No   On the morning of your procedure, take your Aspirin and any morning medicines NOT listed above.  You  may use sips of water.  5. Plan for one night stay--bring personal belongings. 6. Bring a current list of your medications and current insurance cards. 7. You MUST have a responsible person to drive you home. 8. Someone MUST be with you the first 24 hours after you arrive home or your discharge will be delayed. 9. Please wear clothes that are easy to get on and off and wear slip-on shoes.  Thank you for allowing [place] to care for you!   -- [facility]   Follow-Up: At [facility], you and your health needs are our priority.  As part of our continuing mission to provide you with exceptional heart care, we have created designated Provider Care Teams.  These Care Teams include your primary Cardiologist (physician) and Advanced Practice Providers (APPs -  Physician Assistants and Nurse Practitioners) who all work together to provide you with the care you need, when you need it. You will need a follow up appointment in 1 months.       Coronary Angiogram With Stent Coronary angiogram with stent placement is a procedure to widen or open a narrow blood vessel of the heart (coronary artery). Arteries may become blocked by cholesterol buildup (plaques) in the lining of the wall. When a coronary artery becomes partially blocked, blood flow to that area decreases. This may lead to chest pain or a heart attack (myocardial infarction). A stent is a small piece of metal that looks like mesh or a spring. Stent placement may be done  as treatment for a heart attack or right after a coronary angiogram in which a blocked artery is found. Let your health care provider know about:  Any allergies you have.  All medicines you are taking, including vitamins, herbs, eye drops, creams, and over-the-counter medicines.  Any problems you or family members have had with anesthetic medicines.  Any blood disorders you have.  Any surgeries you have had.  Any medical conditions you  have.  Whether you are pregnant or may be pregnant. What are the risks? Generally, this is a safe procedure. However, problems may occur, including:  Damage to the heart or its blood vessels.  A return of blockage.  Bleeding, infection, or bruising at the insertion site.  A collection of blood under the skin (hematoma) at the insertion site.  A blood clot in another part of the body.  Kidney injury.  Allergic reaction to the dye or contrast that is used.  Bleeding into the abdomen (retroperitoneal bleeding). What happens before the procedure? Staying hydrated Follow instructions from your health care provider about hydration, which may include:  Up to 2 hours before the procedure - you may continue to drink clear liquids, such as water, clear fruit juice, black coffee, and plain tea.  Eating and drinking restrictions Follow instructions from your health care provider about eating and drinking, which may include:  8 hours before the procedure - stop eating heavy meals or foods such as meat, fried foods, or fatty foods.  6 hours before the procedure - stop eating light meals or foods, such as toast or cereal.  2 hours before the procedure - stop drinking clear liquids. Ask your health care provider about:  Changing or stopping your regular medicines. This is especially important if you are taking diabetes medicines or blood thinners.  Taking medicines such as ibuprofen. These medicines can thin your blood. Do not take these medicines before your procedure if your health care provider instructs you not to. Generally, aspirin is recommended before a procedure of passing a small, thin tube (catheter) through a blood vessel and into the heart (cardiac catheterization). What happens during the procedure?   An IV tube will be inserted into one of your veins.  You will be given one or more of the following: ? A medicine to help you relax (sedative). ? A medicine to numb the area  where the catheter will be inserted into an artery (local anesthetic).  To reduce your risk of infection: ? Your health care team will wash or sanitize their hands. ? Your skin will be washed with soap. ? Hair may be removed from the area where the catheter will be inserted.  Using a guide wire, the catheter will be inserted into an artery. The location may be in your groin, in your wrist, or in the fold of your arm (near your elbow).  A type of X-ray (fluoroscopy) will be used to help guide the catheter to the opening of the arteries in the heart.  A dye will be injected into the catheter, and X-rays will be taken. The dye will help to show where any narrowing or blockages are located in the arteries.  A tiny wire will be guided to the blocked spot, and a balloon will be inflated to make the artery wider.  The stent will be expanded and will crush the plaques into the wall of the vessel. The stent will hold the area open and improve the blood flow. Most stents have a drug  coating to reduce the risk of the stent narrowing over time.  The artery may be made wider using a drill, laser, or other tools to remove plaques.  When the blood flow is better, the catheter will be removed. The lining of the artery will grow over the stent, which stays where it was placed. This procedure may vary among health care providers and hospitals. What happens after the procedure?  If the procedure is done through the leg, you will be kept in bed lying flat for about 6 hours. You will be instructed to not bend and not cross your legs.  The insertion site will be checked frequently.  The pulse in your foot or wrist will be checked frequently.  You may have additional blood tests, X-rays, and a test that records the electrical activity of your heart (electrocardiogram, or ECG). This information is not intended to replace advice given to you by your health care provider. Make sure you discuss any questions you  have with your health care provider. Document Released: 06/24/2002 Document Revised: 03/29/2017 Document Reviewed: 07/24/2015 Elsevier Interactive Patient Education  2019 [employer].

## 2018-06-21 LAB — BASIC METABOLIC PANEL
BUN/Creatinine Ratio: 13 (ref 10–24)
BUN: 16 mg/dL (ref 8–27)
CO2: 24 mmol/L (ref 20–29)
Calcium: 9.6 mg/dL (ref 8.6–10.2)
Chloride: 104 mmol/L (ref 96–106)
Creatinine, Ser: 1.19 mg/dL (ref 0.76–1.27)
GFR calc Af Amer: 73 mL/min/{1.73_m2} (ref >59)
GFR calc non Af Amer: 63 mL/min/{1.73_m2} (ref >59)
Glucose: 95 mg/dL (ref 65–99)
Potassium: 4.7 mmol/L (ref 3.5–5.2)
Sodium: 138 mmol/L (ref 134–144)

## 2018-06-21 LAB — CBC
Hematocrit: 45.9 % (ref 37.5–51.0)
Hemoglobin: 16.1 g/dL (ref 13.0–17.7)
MCH: 31.9 pg (ref 26.6–33.0)
MCHC: 35.1 g/dL (ref 31.5–35.7)
MCV: 91 fL (ref 79–97)
Platelets: 235 10*3/uL (ref 150–450)
RBC: 5.04 x10E6/uL (ref 4.14–5.80)
RDW: 12.3 % (ref 11.6–15.4)
WBC: 4.3 10*3/uL (ref 3.4–10.8)

## 2018-06-30 ENCOUNTER — Other Ambulatory Visit (HOSPITAL_COMMUNITY)
Admission: RE | Admit: 2018-06-30 | Discharge: 2018-06-30 | Disposition: A | Discharge status: Home / Self Care | Payer: BC Managed Care – PPO | Source: Ambulatory Visit | Attending: Cardiology | Admitting: Cardiology

## 2018-06-30 DIAGNOSIS — Z01812 Encounter for preprocedural laboratory examination: Secondary | ICD-10-CM | POA: Diagnosis not present

## 2018-06-30 DIAGNOSIS — Z1159 Encounter for screening for other viral diseases: Secondary | ICD-10-CM | POA: Insufficient documentation

## 2018-06-30 LAB — SARS CORONAVIRUS 2 (TAT 6-24 HRS): SARS Coronavirus 2: NEGATIVE

## 2018-07-01 ENCOUNTER — Telehealth: Payer: Self-pay | Admitting: *Deleted

## 2018-07-01 NOTE — Telephone Encounter (Signed)
Pt contacted pre-catheterization scheduled at [facility] for: Thursday July 01, 2018 7:30 AM Verified arrival time and place: [place] Entrance A at: 5:30 AM  Covid-19 test date: 06/30/18  No solid food after midnight prior to cath, clear liquids until 5 AM day of procedure. Contrast allergy: no   AM meds can be  taken pre-cath with sip of water including: ASA 81 mg   Confirmed patient has responsible person to drive home post procedure and observe 24 hours after arriving home: yes  Due to Covid-19 pandemic no visitors are allowed in the hospital (unless cognitive impairment).  Their designated party will be called when their procedure is over for an update and to arrange pick up.  Patients are required to wear a mask when they enter the hospital.       COVID-19 Pre-Screening Questions:  . In the past 7 to 10 days have you had a cough,  shortness of breath, headache, congestion, fever (100 or greater) body aches, chills, sore throat, or sudden loss of taste or sense of smell? no . Have you been around anyone with known Covid 19? no . Have you been around anyone who is awaiting Covid 19 test results in the past 7 to 10 days? no . Have you been around anyone who has been exposed to Covid 19, or has mentioned symptoms of Covid 19 within the past 7 to 10 days? no   I reviewed procedure instructions/mask/visitor/Covid-19 screening questions with patient, he verbalized understanding, thanked me for call.

## 2018-07-03 ENCOUNTER — Ambulatory Visit (HOSPITAL_COMMUNITY)
Admission: RE | Admit: 2018-07-03 | Discharge: 2018-07-03 | Disposition: A | Discharge status: Home / Self Care | Payer: BC Managed Care – PPO | Attending: Cardiology | Admitting: Cardiology

## 2018-07-03 ENCOUNTER — Encounter (HOSPITAL_COMMUNITY)
Admission: RE | Disposition: A | Discharge status: Home / Self Care | Payer: Self-pay | Source: Home / Self Care | Attending: Cardiology

## 2018-07-03 ENCOUNTER — Other Ambulatory Visit: Payer: Self-pay

## 2018-07-03 DIAGNOSIS — Z7982 Long term (current) use of aspirin: Secondary | ICD-10-CM | POA: Insufficient documentation

## 2018-07-03 DIAGNOSIS — Z8249 Family history of ischemic heart disease and other diseases of the circulatory system: Secondary | ICD-10-CM | POA: Insufficient documentation

## 2018-07-03 DIAGNOSIS — I2510 Atherosclerotic heart disease of native coronary artery without angina pectoris: Secondary | ICD-10-CM | POA: Diagnosis not present

## 2018-07-03 DIAGNOSIS — K219 Gastro-esophageal reflux disease without esophagitis: Secondary | ICD-10-CM | POA: Insufficient documentation

## 2018-07-03 DIAGNOSIS — K2270 Barrett's esophagus without dysplasia: Secondary | ICD-10-CM | POA: Insufficient documentation

## 2018-07-03 DIAGNOSIS — I8393 Asymptomatic varicose veins of bilateral lower extremities: Secondary | ICD-10-CM | POA: Insufficient documentation

## 2018-07-03 DIAGNOSIS — Z79899 Other long term (current) drug therapy: Secondary | ICD-10-CM | POA: Insufficient documentation

## 2018-07-03 DIAGNOSIS — R931 Abnormal findings on diagnostic imaging of heart and coronary circulation: Secondary | ICD-10-CM | POA: Diagnosis not present

## 2018-07-03 DIAGNOSIS — R079 Chest pain, unspecified: Secondary | ICD-10-CM | POA: Diagnosis present

## 2018-07-03 DIAGNOSIS — N400 Enlarged prostate without lower urinary tract symptoms: Secondary | ICD-10-CM | POA: Insufficient documentation

## 2018-07-03 DIAGNOSIS — I25118 Atherosclerotic heart disease of native coronary artery with other forms of angina pectoris: Secondary | ICD-10-CM | POA: Insufficient documentation

## 2018-07-03 DIAGNOSIS — Z8619 Personal history of other infectious and parasitic diseases: Secondary | ICD-10-CM | POA: Insufficient documentation

## 2018-07-03 HISTORY — PX: LEFT HEART CATH AND CORONARY ANGIOGRAPHY: CATH118249

## 2018-07-03 SURGERY — LEFT HEART CATH AND CORONARY ANGIOGRAPHY
Anesthesia: LOCAL

## 2018-07-03 MED ORDER — SODIUM CHLORIDE 0.9 % IV SOLN: 250.0000 mL | INTRAVENOUS | Status: DC | PRN

## 2018-07-03 MED ORDER — FENTANYL CITRATE (PF) 100 MCG/2ML IJ SOLN
INTRAMUSCULAR | Status: AC
  Filled 2018-07-03: qty 2

## 2018-07-03 MED ORDER — ONDANSETRON HCL 4 MG/2ML IJ SOLN: 4.0000 mg | Freq: Four times a day (QID) | INTRAMUSCULAR | Status: DC | PRN

## 2018-07-03 MED ORDER — SODIUM CHLORIDE 0.9 % WEIGHT BASED INFUSION: 1.0000 mL/kg/h | INTRAVENOUS | Status: DC

## 2018-07-03 MED ORDER — HEPARIN (PORCINE) IN NACL 1000-0.9 UT/500ML-% IV SOLN
INTRAVENOUS | Status: DC | PRN
  Administered 2018-07-03 (×2): 500 mL

## 2018-07-03 MED ORDER — HEPARIN SODIUM (PORCINE) 1000 UNIT/ML IJ SOLN
INTRAMUSCULAR | Status: DC | PRN
  Administered 2018-07-03: 5500 [IU] via INTRAVENOUS

## 2018-07-03 MED ORDER — IOHEXOL 350 MG/ML SOLN
INTRAVENOUS | Status: DC | PRN
  Administered 2018-07-03: 80 mL via INTRACARDIAC

## 2018-07-03 MED ORDER — MIDAZOLAM HCL 2 MG/2ML IJ SOLN
INTRAMUSCULAR | Status: AC
  Filled 2018-07-03: qty 2

## 2018-07-03 MED ORDER — SODIUM CHLORIDE 0.9% FLUSH: 3.0000 mL | INTRAVENOUS | Status: DC | PRN

## 2018-07-03 MED ORDER — ACETAMINOPHEN 325 MG PO TABS: 650.0000 mg | ORAL_TABLET | ORAL | Status: DC | PRN

## 2018-07-03 MED ORDER — SODIUM CHLORIDE 0.9 % WEIGHT BASED INFUSION
3.0000 mL/kg/h | INTRAVENOUS | Status: AC
  Administered 2018-07-03: 3 mL/kg/h via INTRAVENOUS

## 2018-07-03 MED ORDER — VERAPAMIL HCL 2.5 MG/ML IV SOLN
INTRAVENOUS | Status: AC
  Filled 2018-07-03: qty 2

## 2018-07-03 MED ORDER — FENTANYL CITRATE (PF) 100 MCG/2ML IJ SOLN
INTRAMUSCULAR | Status: DC | PRN
  Administered 2018-07-03: 25 ug via INTRAVENOUS

## 2018-07-03 MED ORDER — VERAPAMIL HCL 2.5 MG/ML IV SOLN
INTRAVENOUS | Status: DC | PRN
  Administered 2018-07-03: 10 mL via INTRA_ARTERIAL

## 2018-07-03 MED ORDER — MIDAZOLAM HCL 2 MG/2ML IJ SOLN
INTRAMUSCULAR | Status: DC | PRN
  Administered 2018-07-03: 1 mg via INTRAVENOUS

## 2018-07-03 MED ORDER — LIDOCAINE HCL (PF) 1 % IJ SOLN
INTRAMUSCULAR | Status: DC | PRN
  Administered 2018-07-03: 2 mL via INTRADERMAL

## 2018-07-03 MED ORDER — HEPARIN (PORCINE) IN NACL 2000-0.9 UNIT/L-% IV SOLN
INTRAVENOUS | Status: AC
  Filled 2018-07-03: qty 1000

## 2018-07-03 MED ORDER — HEPARIN (PORCINE) IN NACL 1000-0.9 UT/500ML-% IV SOLN
INTRAVENOUS | Status: AC
  Filled 2018-07-03: qty 1000

## 2018-07-03 MED ORDER — LIDOCAINE HCL (PF) 1 % IJ SOLN
INTRAMUSCULAR | Status: AC
  Filled 2018-07-03: qty 30

## 2018-07-03 MED ORDER — ASPIRIN 81 MG PO CHEW: 81.0000 mg | CHEWABLE_TABLET | ORAL | Status: AC

## 2018-07-03 MED ORDER — SODIUM CHLORIDE 0.9% FLUSH: 3.0000 mL | Freq: Two times a day (BID) | INTRAVENOUS | Status: DC

## 2018-07-03 SURGICAL SUPPLY — 11 items
CATH 5FR JL3.5 JR4 ANG PIG MP (CATHETERS) ×1 IMPLANT
DEVICE RAD COMP TR BAND LRG (VASCULAR PRODUCTS) ×1 IMPLANT
ELECT DEFIB PAD ADLT CADENCE (PAD) ×1 IMPLANT
GLIDESHEATH SLEND SS 6F .021 (SHEATH) ×1 IMPLANT
GUIDEWIRE INQWIRE 1.5J.035X260 (WIRE) IMPLANT
INQWIRE 1.5J .035X260CM (WIRE) ×2
KIT HEART LEFT (KITS) ×2 IMPLANT
PACK CARDIAC CATHETERIZATION (CUSTOM PROCEDURE TRAY) ×2 IMPLANT
SYR MEDRAD MARK 7 150ML (SYRINGE) ×2 IMPLANT
TRANSDUCER W/STOPCOCK (MISCELLANEOUS) ×2 IMPLANT
TUBING CIL FLEX 10 FLL-RA (TUBING) ×2 IMPLANT

## 2018-07-03 NOTE — Discharge Instructions (Signed)
Radial Site Care  This sheet gives you information about how to care for yourself after your procedure. Your health care provider may also give you more specific instructions. If you have problems or questions, contact your health care provider. What can I expect after the procedure? After the procedure, it is common to have:  Bruising and tenderness at the catheter insertion area. Follow these instructions at home: Medicines  Take over-the-counter and prescription medicines only as told by your health care provider. Insertion site care  Follow instructions from your health care provider about how to take care of your insertion site. Make sure you: ? Wash your hands with soap and water before you change your bandage (dressing). If soap and water are not available, use hand sanitizer. ? Change your dressing as told by your health care provider. ? Leave stitches (sutures), skin glue, or adhesive strips in place. These skin closures may need to stay in place for 2 weeks or longer. If adhesive strip edges start to loosen and curl up, you may trim the loose edges. Do not remove adhesive strips completely unless your health care provider tells you to do that.  Check your insertion site every day for signs of infection. Check for: ? Redness, swelling, or pain. ? Fluid or blood. ? Pus or a bad smell. ? Warmth.  Do not take baths, swim, or use a hot tub until your health care provider approves.  You may shower 24-48 hours after the procedure, or as directed by your health care provider. ? Remove the dressing and gently wash the site with plain soap and water. ? Pat the area dry with a clean towel. ? Do not rub the site. That could cause bleeding.  Do not apply powder or lotion to the site. Activity   For 24 hours after the procedure, or as directed by your health care provider: ? Do not flex or bend the affected arm. ? Do not push or pull heavy objects with the affected arm. ? Do not  drive yourself home from the hospital or clinic. You may drive 24 hours after the procedure unless your health care provider tells you not to. ? Do not operate machinery or power tools.  Do not lift anything that is heavier than 10 lb (4.5 kg), or the limit that you are told, until your health care provider says that it is safe.  Ask your health care provider when it is okay to: ? Return to work or school. ? Resume usual physical activities or sports. ? Resume sexual activity. General instructions  If the catheter site starts to bleed, raise your arm and put firm pressure on the site. If the bleeding does not stop, get help right away. This is a medical emergency.  If you went home on the same day as your procedure, a responsible adult should be with you for the first 24 hours after you arrive home.  Keep all follow-up visits as told by your health care provider. This is important. Contact a health care provider if:  You have a fever.  You have redness, swelling, or yellow drainage around your insertion site. Get help right away if:  You have unusual pain at the radial site.  The catheter insertion area swells very fast.  The insertion area is bleeding, and the bleeding does not stop when you hold steady pressure on the area.  Your arm or hand becomes pale, cool, tingly, or numb. These symptoms may represent a serious problem   that is an emergency. Do not wait to see if the symptoms will go away. Get medical help right away. Call your local emergency services (911 in the [place] Do not drive yourself to the hospital. Summary  After the procedure, it is common to have bruising and tenderness at the site.  Follow instructions from your health care provider about how to take care of your radial site wound. Check the wound every day for signs of infection.  Do not lift anything that is heavier than 10 lb (4.5 kg), or the limit that you are told, until your health care provider says  that it is safe. This information is not intended to replace advice given to you by your health care provider. Make sure you discuss any questions you have with your health care provider. Document Released: 01/20/2010 Document Revised: 01/23/2017 Document Reviewed: 01/23/2017 Elsevier Patient Education  2020 [employer]  Moderate Conscious Sedation, Adult, Care After These instructions provide you with information about caring for yourself after your procedure. Your health care provider may also give you more specific instructions. Your treatment has been planned according to current medical practices, but problems sometimes occur. Call your health care provider if you have any problems or questions after your procedure. What can I expect after the procedure? After your procedure, it is common:  To feel sleepy for several hours.  To feel clumsy and have poor balance for several hours.  To have poor judgment for several hours.  To vomit if you eat too soon. Follow these instructions at home: For at least 24 hours after the procedure:   Do not: ? Participate in activities where you could fall or become injured. ? Drive. ? Use heavy machinery. ? Drink alcohol. ? Take sleeping pills or medicines that cause drowsiness. ? Make important decisions or sign legal documents. ? Take care of children on your own.  Rest. Eating and drinking  Follow the diet recommended by your health care provider.  If you vomit: ? Drink water, juice, or soup when you can drink without vomiting. ? Make sure you have little or no nausea before eating solid foods. General instructions  Have a responsible adult stay with you until you are awake and alert.  Take over-the-counter and prescription medicines only as told by your health care provider.  If you smoke, do not smoke without supervision.  Keep all follow-up visits as told by your health care provider. This is important. Contact a health care  provider if:  You keep feeling nauseous or you keep vomiting.  You feel light-headed.  You develop a rash.  You have a fever. Get help right away if:  You have trouble breathing. This information is not intended to replace advice given to you by your health care provider. Make sure you discuss any questions you have with your health care provider. Document Released: 10/08/2012 Document Revised: 11/30/2016 Document Reviewed: 04/09/2015 Elsevier Patient Education  2020 Elsevier Inc.  

## 2018-07-03 NOTE — Interval H&P Note (Signed)
History and Physical Interval Note:  07/03/2018 7:10 AM  Jacob Ray  has presented today for surgery, with the diagnosis of abnormal cardiac CTA.  The various methods of treatment have been discussed with the patient and family. After consideration of risks, benefits and other options for treatment, the patient has consented to  Procedure(s): LEFT HEART CATH AND CORONARY ANGIOGRAPHY (N/A) as a surgical intervention.  The patient's history has been reviewed, patient examined, no change in status, stable for surgery.  I have reviewed the patient's chart and labs.  Questions were answered to the patient's satisfaction.   Cath Lab Visit (complete for each Cath Lab visit)  Clinical Evaluation Leading to the Procedure:   ACS: No.  Non-ACS:    Anginal Classification: CCS I  Anti-ischemic medical therapy: No Therapy  Non-Invasive Test Results: Intermediate-risk stress test findings: cardiac mortality 1-3%/year  Prior CABG: No previous CABG        Collier Salina [facility] 07/03/2018 7:10 AM

## 2018-07-07 ENCOUNTER — Encounter (HOSPITAL_COMMUNITY): Payer: Self-pay | Admitting: Cardiology

## 2018-07-14 ENCOUNTER — Telehealth: Payer: Self-pay | Admitting: Gastroenterology

## 2018-07-14 NOTE — Telephone Encounter (Signed)
Pt requested a refill on his "digestive pill."  He does not know the name of thje medication.

## 2018-07-15 NOTE — Telephone Encounter (Signed)
LM for pt to call back to discuss. Is he referring to famotidine or a PPI? (he has been on Protonix 20mg  once daily in the past for Barrett's - see 09-2017 phone note).  He hasn't been seen since 2018 though so it would be best to schedule a virtual visit with [name] to discuss.

## 2018-07-16 NOTE — Telephone Encounter (Signed)
Pt returning your call

## 2018-07-17 MED ORDER — PANTOPRAZOLE SODIUM 20 MG PO TBEC: 20.0000 mg | DELAYED_RELEASE_TABLET | Freq: Every day | ORAL | 1 refills | Status: DC

## 2018-07-17 NOTE — Telephone Encounter (Signed)
Called and spoke to pt. He is not sure what he has been taking. I let him know he was supposed to be on a PPI due to his Barrett's Esophagus. I spoke to [name] and he confirmed he should be on a PPI. Sent prescription to pharmacy for Protonix 20mg  once daily according to chart notes and scheduled pt for an appt with [facility] in August to discuss further.

## 2018-07-17 NOTE — Addendum Note (Signed)
Addended by: Roetta Sessions on: 07/17/2018 05:24 PM   Modules accepted: Orders

## 2018-07-18 ENCOUNTER — Other Ambulatory Visit: Payer: Self-pay

## 2018-07-18 MED ORDER — PANTOPRAZOLE SODIUM 20 MG PO TBEC: 20.0000 mg | DELAYED_RELEASE_TABLET | Freq: Every day | ORAL | 0 refills | Status: DC

## 2018-07-21 ENCOUNTER — Other Ambulatory Visit: Payer: Self-pay

## 2018-07-21 MED ORDER — PANTOPRAZOLE SODIUM 20 MG PO TBEC: 20.0000 mg | DELAYED_RELEASE_TABLET | Freq: Every day | ORAL | 0 refills | Status: DC

## 2018-07-24 NOTE — Progress Notes (Signed)
Cardiology Office Note:    Date:  07/25/2018   ID:  [name], DOB January 03, 1950, MRN [id number]  PCP:  Colon Branch, MD  Cardiologist:  Shirlee More, MD    Referring MD: Colon Branch, MD    ASSESSMENT:    1. Coronary artery disease of native artery of native heart with stable angina pectoris ([place])    PLAN:    In order of problems listed above:  1. He has mild single-vessel CAD presently not having angina he will continue low-dose aspirin 81 coated daily he has no trouble tolerating it with his reflux disease and will initiate a low-dose of a low intensity statin pravastatin 20 mg daily follow-up labs in 1 month.  I will plan to see him in follow-up in my office in 1 year   Next appointment: 1 year   Medication Adjustments/Labs and Tests Ordered: Current medicines are reviewed at length with the patient today.  Concerns regarding medicines are outlined above.  No orders of the defined types were placed in this encounter.  No orders of the defined types were placed in this encounter.   Chief Complaint  Patient presents with  . Coronary Artery Disease    History of Present Illness:    Jacob Ray is a 68 y.o. male with a hx of chest pain with recent abnormal cardiac CTA last seen 02/28/2018.  His calcium score was quite low 4.6 the initial review suggested nonobstructive proximal LAD stenosis, however fractional flow reserve was decreased with a final conclusion of hemodynamically significant stenosis in the proximal left anterior descending coronary artery FFR 0.67-0.72.   He was  last seen 06/20/2018 and referred for left heart cath..  He is reassured by the coronary angiography report.  He is having no angina.  He tells me has been told he has a heart murmur I examined him in detail and there is none on physical exam.  We had a nice discussion about medical therapy and progression of CAD and he agrees to continue low-dose aspirin and despite an LDL less than 100  start low-dose of a low intensity statin with a goal LDL less than 50.  I will do follow-up labs in my office in 1 month plan to see him in 1 year unless he is having frequent angina  LEFT HEART CATH AND CORONARY ANGIOGRAPHY  Conclusion 07/03/2018   Prox LAD lesion is 40% stenosed.  Mid LAD to Dist LAD lesion is 30% stenosed.  The left ventricular systolic function is normal.  LV end diastolic pressure is normal.  The left ventricular ejection fraction is 55-65% by visual estimate.   1. Nonobstructive CAD 2. Normal LV function 3. Normal LVEDP  Plan: risk factor modification/medical management.     Compliance with diet, lifestyle and medications: Yes Past Medical History:  Diagnosis Date  . Barrett's esophagus    EGD on 05/04/2013, next due in 5/18, [facility]  . BCC (basal cell carcinoma), arm 03/2013   Right forearm  . Blepharitis, right eye 2013  . BPH (benign prostatic hyperplasia)   . GERD (gastroesophageal reflux disease)   . Heart murmur   . History of Helicobacter pylori infection 05/2013  . Mitral regurgitation 2009   mild per ECHO  . Shingles 2013  . Varicose veins    bilateral    Past Surgical History:  Procedure Laterality Date  . LEFT HEART CATH AND CORONARY ANGIOGRAPHY N/A 07/03/2018   Procedure: LEFT HEART CATH AND CORONARY ANGIOGRAPHY;  Surgeon: [name], [name], MD;  Location: [place] [facility];  Service: Cardiovascular;  Laterality: N/A;  . SPIROMETRY  03/18/2013   [facility]  . TONSILLECTOMY    . WISDOM TOOTH EXTRACTION      Current Medications: Current Meds  Medication Sig  . aspirin EC 81 MG tablet Take 81 mg by mouth daily.  . Multiple Vitamins-Minerals (MULTIVITAMIN WITH MINERALS) tablet Take 2 tablets by mouth daily. vitafusion  . nitroGLYCERIN (NITROSTAT) 0.4 MG SL tablet Place 1 tablet (0.4 mg total) under the tongue every 5 (five) minutes x 3 doses as needed for chest pain.  . pantoprazole (PROTONIX) 20 MG tablet Take 1  tablet (20 mg total) by mouth daily. Please keep your appt with [name] in August for further refills  . Vitamin D, Ergocalciferol, (DRISDOL) 1.25 MG (50000 UT) CAPS capsule Take 1 capsule (50,000 Units total) by mouth every 7 (seven) days. For 12 weeks     Allergies:   Patient has no known allergies.   Social History   Socioeconomic History  . Marital status: Married    Spouse name: Not on file  . Number of children: 1  . Years of education: Not on file  . Highest education level: Not on file  Occupational History  . Occupation: Press photographer , works full time   Social Needs  . Financial resource strain: Not on file  . Food insecurity    Worry: Not on file    Inability: Not on file  . Transportation needs    Medical: Not on file    Non-medical: Not on file  Tobacco Use  . Smoking status: Never Smoker  . Smokeless tobacco: Current User    Types: Chew  Substance and Sexual Activity  . Alcohol use: Yes    Alcohol/week: 3.0 - 5.0 standard drinks    Types: 3 - 5 Cans of beer per week    Comment: 2 times week   . Drug use: No  . Sexual activity: Not on file  Lifestyle  . Physical activity    Days per week: Not on file    Minutes per session: Not on file  . Stress: Not on file  Relationships  . Social Herbalist on phone: Not on file    Gets together: Not on file    Attends religious service: Not on file    Active member of club or organization: Not on file    Attends meetings of clubs or organizations: Not on file    Relationship status: Not on file  Other Topics Concern  . Not on file  Social History Narrative   Household-- pt and wife   Adult child      Family History: The patient's family history includes CAD in his brother; CAD (age of onset: 73) in his father; Diabetes in his brother; Hypertension in his father; Obesity in his brother; Prostate cancer (age of onset: 40) in his father; Valvular heart disease in his brother and father. There is no  history of Colon cancer. ROS:   Please see the history of present illness.    All other systems reviewed and are negative.  EKGs/Labs/Other Studies Reviewed:    The following studies were reviewed today:  Recent Labs: 02/24/2018: ALT 16; TSH 4.96 06/20/2018: BUN 16; Creatinine, Ser 1.19; Hemoglobin 16.1; Platelets 235; Potassium 4.7; Sodium 138  Recent Lipid Panel    Component Value Date/Time   CHOL 161 02/24/2018 0751   TRIG 122.0  02/24/2018 0751   HDL 50.30 02/24/2018 0751   CHOLHDL 3 02/24/2018 0751   VLDL 24.4 02/24/2018 0751   LDLCALC 87 02/24/2018 0751   LDLDIRECT 93.0 11/15/2014 0751    Physical Exam:    VS:  BP 106/78 (BP Location: Right Arm, Patient Position: Sitting, Cuff Size: Large)   Pulse 67   Temp 98.6 F (37 C)   Ht 6\' 2"  (1.88 m)   Wt 230 lb 1.9 oz (104.4 kg)   SpO2 98%   BMI 29.55 kg/m     Wt Readings from Last 3 Encounters:  07/25/18 230 lb 1.9 oz (104.4 kg)  07/03/18 237 lb (107.5 kg)  06/20/18 229 lb 12.8 oz (104.2 kg)     GEN:  Well nourished, well developed in no acute distress HEENT: Normal NECK: No JVD; No carotid bruits LYMPHATICS: No lymphadenopathy CARDIAC: RRR, no murmurs, rubs, gallops RESPIRATORY:  Clear to auscultation without rales, wheezing or rhonchi  ABDOMEN: Soft, non-tender, non-distended MUSCULOSKELETAL:  No edema; No deformity  SKIN: Warm and dry NEUROLOGIC:  Alert and oriented x 3 PSYCHIATRIC:  Normal affect    Signed, Shirlee More, MD  07/25/2018 8:36 AM    [place]

## 2018-07-25 ENCOUNTER — Ambulatory Visit (INDEPENDENT_AMBULATORY_CARE_PROVIDER_SITE_OTHER): Payer: BC Managed Care – PPO | Admitting: Cardiology

## 2018-07-25 ENCOUNTER — Other Ambulatory Visit: Payer: Self-pay

## 2018-07-25 VITALS — BP 106/78 | HR 67 | Temp 98.6°F | Ht 74.0 in | Wt 230.1 lb

## 2018-07-25 DIAGNOSIS — I25118 Atherosclerotic heart disease of native coronary artery with other forms of angina pectoris: Secondary | ICD-10-CM

## 2018-07-25 MED ORDER — PRAVASTATIN SODIUM 20 MG PO TABS: 20.0000 mg | ORAL_TABLET | Freq: Every evening | ORAL | 3 refills | Status: DC

## 2018-07-25 NOTE — Patient Instructions (Signed)
Medication Instructions:  Your physician has recommended you make the following change in your medication:   START: Pravastatin 20 mg: Take 1 tab every evening  If you need a refill on your cardiac medications before your next appointment, please call your pharmacy.   Lab work: Your physician recommends that you return for lab work in: 1 MONTH CMP,LIPID(Fasting)  If you have labs (blood work) drawn today and your tests are completely normal, you will receive your results only by: Marland Kitchen MyChart Message (if you have MyChart) OR . A paper copy in the mail If you have any lab test that is abnormal or we need to change your treatment, we will call you to review the results.  Testing/Procedures: None  Follow-Up: At [facility], you and your health needs are our priority.  As part of our continuing mission to provide you with exceptional heart care, we have created designated Provider Care Teams.  These Care Teams include your primary Cardiologist (physician) and Advanced Practice Providers (APPs -  Physician Assistants and Nurse Practitioners) who all work together to provide you with the care you need, when you need it. You will need a follow up appointment in 1 years with Dr. Bettina Gavia Any Other Special Instructions Will Be Listed Below (If Applicable).

## 2018-08-14 ENCOUNTER — Other Ambulatory Visit: Payer: Self-pay | Admitting: Gastroenterology

## 2018-08-22 ENCOUNTER — Ambulatory Visit (INDEPENDENT_AMBULATORY_CARE_PROVIDER_SITE_OTHER): Payer: BC Managed Care – PPO | Admitting: Internal Medicine

## 2018-08-22 ENCOUNTER — Encounter: Payer: Self-pay | Admitting: Internal Medicine

## 2018-08-22 ENCOUNTER — Other Ambulatory Visit: Payer: Self-pay

## 2018-08-22 VITALS — BP 109/76 | HR 65 | Temp 96.9°F | Resp 18 | Ht 74.0 in | Wt 229.4 lb

## 2018-08-22 DIAGNOSIS — E785 Hyperlipidemia, unspecified: Secondary | ICD-10-CM | POA: Diagnosis not present

## 2018-08-22 DIAGNOSIS — I25118 Atherosclerotic heart disease of native coronary artery with other forms of angina pectoris: Secondary | ICD-10-CM | POA: Diagnosis not present

## 2018-08-22 NOTE — Progress Notes (Signed)
CPre visit review using our clinic review tool, if applicable. No additional management support is needed unless otherwise documented below in the visit note.  

## 2018-08-22 NOTE — Progress Notes (Signed)
Subjective:    Patient ID: Jacob Ray, male    DOB: Oct 10, 1950, 68 y.o.   MRN: [id number]  DOS:  08/22/2018 Type of visit - description: Follow-up Since the last office visit when he complained of chest pain, saw cardiology, had a cardiac catheterization >>> single vessel disease, was recommended medical treatment, started Pravachol less than a month ago. No further chest pain No apparent side effects from statins   Review of Systems In a couple of occasions he had "fainty feeling" that lasted between 1 and 3 seconds in the context of doing heavy carpet cleaning and sweating. Episodes self resolve, no associated chest pain, shortness of breath, palpitations, LOC. During 1 of the episodes according to a bystander he looked pale.   Past Medical History:  Diagnosis Date  . Barrett's esophagus    EGD on 05/04/2013, next due in 5/18, [facility]  . BCC (basal cell carcinoma), arm 03/2013   Right forearm  . Blepharitis, right eye 2013  . BPH (benign prostatic hyperplasia)   . GERD (gastroesophageal reflux disease)   . Heart murmur   . History of Helicobacter pylori infection 05/2013  . Mitral regurgitation 2009   mild per ECHO  . Shingles 2013  . Varicose veins    bilateral    Past Surgical History:  Procedure Laterality Date  . LEFT HEART CATH AND CORONARY ANGIOGRAPHY N/A 07/03/2018   Procedure: LEFT HEART CATH AND CORONARY ANGIOGRAPHY;  Surgeon: [name], [name], MD;  Location: [place] [facility];  Service: Cardiovascular;  Laterality: N/A;  . SPIROMETRY  03/18/2013   [facility]  . TONSILLECTOMY    . WISDOM TOOTH EXTRACTION      Social History   Socioeconomic History  . Marital status: Married    Spouse name: Not on file  . Number of children: 1  . Years of education: Not on file  . Highest education level: Not on file  Occupational History  . Occupation: Press photographer , works full time   Social Needs  . Financial resource strain: Not on file  . Food  insecurity    Worry: Not on file    Inability: Not on file  . Transportation needs    Medical: Not on file    Non-medical: Not on file  Tobacco Use  . Smoking status: Never Smoker  . Smokeless tobacco: Current User    Types: Chew  Substance and Sexual Activity  . Alcohol use: Yes    Alcohol/week: 3.0 - 5.0 standard drinks    Types: 3 - 5 Cans of beer per week    Comment: 2 times week   . Drug use: No  . Sexual activity: Not on file  Lifestyle  . Physical activity    Days per week: Not on file    Minutes per session: Not on file  . Stress: Not on file  Relationships  . Social Herbalist on phone: Not on file    Gets together: Not on file    Attends religious service: Not on file    Active member of club or organization: Not on file    Attends meetings of clubs or organizations: Not on file    Relationship status: Not on file  . Intimate partner violence    Fear of current or ex partner: Not on file    Emotionally abused: Not on file    Physically abused: Not on file    Forced sexual activity: Not on file  Other Topics Concern  . Not on file  Social History Narrative   Household-- pt and wife   Adult child       Allergies as of 08/22/2018   No Known Allergies     Medication List       Accurate as of August 22, 2018 11:59 PM. If you have any questions, ask your nurse or doctor.        aspirin EC 81 MG tablet Take 81 mg by mouth daily.   multivitamin with minerals tablet Take 2 tablets by mouth daily. vitafusion   nitroGLYCERIN 0.4 MG SL tablet Commonly known as: NITROSTAT Place 1 tablet (0.4 mg total) under the tongue every 5 (five) minutes x 3 doses as needed for chest pain.   pantoprazole 20 MG tablet Commonly known as: PROTONIX TAKE 1 TABLET BY MOUTH EVERY DAY   pravastatin 20 MG tablet Commonly known as: PRAVACHOL Take 1 tablet (20 mg total) by mouth every evening.   Vitamin D (Ergocalciferol) 1.25 MG (50000 UT) Caps capsule Commonly  known as: DRISDOL Take 1 capsule (50,000 Units total) by mouth every 7 (seven) days. For 12 weeks           Objective:   Physical Exam BP 109/76 (BP Location: Left Arm, Patient Position: Sitting, Cuff Size: Normal)   Pulse 65   Temp (!) 96.9 F (36.1 C) (Temporal)   Resp 18   Ht 6\' 2"  (1.88 m)   Wt 229 lb 6 oz (104 kg)   SpO2 96%   BMI 29.45 kg/m  General:   Well developed, NAD, BMI noted. HEENT:  Normocephalic . Face symmetric, atraumatic Lungs:  CTA B Normal respiratory effort, no intercostal retractions, no accessory muscle use. Heart: RRR,  no murmur.  No pretibial edema bilaterally  Skin: Not pale. Not jaundice Neurologic:  alert & oriented X3.  Speech normal, gait appropriate for age and unassisted Psych--  Cognition and judgment appear intact.  Cooperative with normal attention span and concentration.  Behavior appropriate. No anxious or depressed appearing.      Assessment     Assessment  GERD --   [name] , s/p EGD x 2, no major findings per pt  Esophageal  stricture, dilatation 07/12/2010 History of Barrett's esophagus, last EGD 05/04/2013: Pathology: Barrett's esophagitis, eosinophilic esophagitis, eosinophilic gastritis, gastric polyp, H. pylori infection. H. pylori testing 11-2015: Negative EGD, [name], 11/2016: + Bx for Barrett's , EGD 3 years. CAD: had CP, cath 07/2018: Mild single-vessel disease.  Medical therapy H/o  BPH, has seen urology. Skin cancer: BCC per biopsy 03/24/2013, sees derm as off 02-2017 Reportedly mild mitral regurgitation by echo 2009 Remote history of low vitamin D  Shingles 2013 H/o kidney stone ~ 09-2015 per pt report Varicose veins, right ankle  PLAN: CAD: Since the last visit had a cardiac cath, single vessel disease, on medical treatment. On aspirin,   encouraged to carry NTG w/ him. High cholesterol: On Pravachol, labs in 2 to 3 weeks fasting per cards. Near fainting: As described above, recommend  observation for now, symptoms not associated with chest pain, lasted 1 to 3 seconds. Preventive care: Recommend flu shot this season RTC 6 months CPX

## 2018-08-22 NOTE — Patient Instructions (Signed)
   GO TO THE FRONT DESK Schedule labs to be done in 2 weeks  Schedule a physical exam in 6 months

## 2018-08-24 DIAGNOSIS — E785 Hyperlipidemia, unspecified: Secondary | ICD-10-CM | POA: Insufficient documentation

## 2018-08-24 NOTE — Assessment & Plan Note (Signed)
CAD: Since the last visit had a cardiac cath, single vessel disease, on medical treatment. On aspirin,   encouraged to carry NTG w/ him. High cholesterol: On Pravachol, labs in 2 to 3 weeks fasting per cards. Near fainting: As described above, recommend observation for now, symptoms not associated with chest pain, lasted 1 to 3 seconds. Preventive care: Recommend flu shot this season RTC 6 months CPX

## 2018-08-25 ENCOUNTER — Ambulatory Visit (INDEPENDENT_AMBULATORY_CARE_PROVIDER_SITE_OTHER): Payer: BC Managed Care – PPO | Admitting: Gastroenterology

## 2018-08-25 VITALS — BP 122/72 | HR 64 | Temp 98.3°F | Ht 74.0 in | Wt 235.0 lb

## 2018-08-25 DIAGNOSIS — K219 Gastro-esophageal reflux disease without esophagitis: Secondary | ICD-10-CM | POA: Diagnosis not present

## 2018-08-25 DIAGNOSIS — K2270 Barrett's esophagus without dysplasia: Secondary | ICD-10-CM | POA: Diagnosis not present

## 2018-08-25 MED ORDER — PANTOPRAZOLE SODIUM 20 MG PO TBEC: 20.0000 mg | DELAYED_RELEASE_TABLET | Freq: Every day | ORAL | 4 refills | Status: DC

## 2018-08-25 NOTE — Progress Notes (Signed)
HPI :  68 y/o male with a history of GERD and short segment Barrett's esophagus, here for a follow up visit.  Records from prior workup with Dr. Virgel Bouquet obtained as outlined:  Colonoscopy 05/18/2013 - poor prep reported, no polyps removed EGD 05/04/2013 - 6cm segment of Barrett's esophagus reported, (+) for nondysplastic Barrett's on pathology Colonoscopy 05/15/2010 - poor prep reported, no polyps removed EGD 07/12/2010 - distal esophageal stricture, Barrett's esophagus  Since his last visit with [place] he had an EGD and colonoscopy in November 2018 as outlined:  EGD 11/15/2016 - 4 cm hiatal hernia was present.. A very mild benign appearing stenosis was noted at the GEJ. The patient denied any dysphagia, no dilation performed. The Z-line was slightly irregular with a slight extension of salmon colored mucosa, but no evidence of previously described long segment of Barrett's. 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 Mild gastritis, HP negative.  Colonoscopy 11/15/2016 - Fair prep initially, following lavage the exam was adequate for screening purposes, Anal papilla(e) were hypertrophied., internal hemorrhoids, yhe examination was otherwise normal. No polyps.  I had recommended that the patient take omeprazole 20mg  once daily after having his EGD done.  He has been on protonix 20mg  once daily since I've seen him.  In general he is doing really well without any complaints.  On Protonix 20 mg once a day his pyrosis and regurgitation is very well controlled.  He denies any dysphasia.  He is eating well, no nausea or vomiting.  No abdominal pain.  He denies any family history of esophageal cancer.        Past Medical History:  Diagnosis Date  . Barrett's esophagus    EGD on 05/04/2013, next due in 5/18, [facility]  . BCC (basal cell carcinoma), arm 03/2013   Right forearm  . Blepharitis, right eye 2013  . BPH (benign prostatic hyperplasia)   . GERD (gastroesophageal reflux disease)   . Heart murmur    . History of Helicobacter pylori infection 05/2013  . Mitral regurgitation 2009   mild per ECHO  . Shingles 2013  . Varicose veins    bilateral     Past Surgical History:  Procedure Laterality Date  . LEFT HEART CATH AND CORONARY ANGIOGRAPHY N/A 07/03/2018   Procedure: LEFT HEART CATH AND CORONARY ANGIOGRAPHY;  Surgeon: [name], [name], MD;  Location: [place] [facility];  Service: Cardiovascular;  Laterality: N/A;  . SPIROMETRY  03/18/2013   [facility]  . TONSILLECTOMY    . WISDOM TOOTH EXTRACTION     Family History  Problem Relation Age of Onset  . CAD Father 105  . Hypertension Father   . Prostate cancer Father 68  . Valvular heart disease Father   . Valvular heart disease Brother        53  . CAD Brother        54  . Diabetes Brother   . Obesity Brother   . Colon cancer Neg Hx        F ?   Social History   Tobacco Use  . Smoking status: Never Smoker  . Smokeless tobacco: Current User    Types: Chew  Substance Use Topics  . Alcohol use: Yes    Alcohol/week: 3.0 - 5.0 standard drinks    Types: 3 - 5 Cans of beer per week    Comment: 2 times week   . Drug use: No   Current Outpatient Medications  Medication Sig Dispense Refill  . aspirin  EC 81 MG tablet Take 81 mg by mouth daily.    . Multiple Vitamins-Minerals (MULTIVITAMIN WITH MINERALS) tablet Take 2 tablets by mouth daily. vitafusion    . nitroGLYCERIN (NITROSTAT) 0.4 MG SL tablet Place 1 tablet (0.4 mg total) under the tongue every 5 (five) minutes x 3 doses as needed for chest pain. (Patient not taking: Reported on 08/22/2018) 20 tablet 1  . pantoprazole (PROTONIX) 20 MG tablet Take 1 tablet (20 mg total) by mouth daily. 90 tablet 4  . pravastatin (PRAVACHOL) 20 MG tablet Take 1 tablet (20 mg total) by mouth every evening. 30 tablet 3  . Vitamin D, Ergocalciferol, (DRISDOL) 1.25 MG (50000 UT) CAPS capsule Take 1 capsule (50,000 Units total) by mouth every 7 (seven) days. For 12 weeks (Patient not  taking: Reported on 08/22/2018) 12 capsule 0   No current facility-administered medications for this visit.    No Known Allergies   Review of Systems: All systems reviewed and negative except where noted in HPI.   Lab Results  Component Value Date   WBC 4.3 06/20/2018   HGB 16.1 06/20/2018   HCT 45.9 06/20/2018   MCV 91 06/20/2018   PLT 235 06/20/2018    Lab Results  Component Value Date   CREATININE 1.19 06/20/2018   BUN 16 06/20/2018   NA 138 06/20/2018   K 4.7 06/20/2018   CL 104 06/20/2018   CO2 24 06/20/2018    Lab Results  Component Value Date   ALT 16 02/24/2018   AST 14 02/24/2018   ALKPHOS 55 02/24/2018   BILITOT 1.3 (H) 02/24/2018     Physical Exam: BP 122/72   Pulse 64   Temp 98.3 F (36.8 C)   Ht 6\' 2"  (1.88 m)   Wt 235 lb (106.6 kg)   BMI 30.17 kg/m  Constitutional: Pleasant,well-developed,male in no acute distress. HEENT: Normocephalic and atraumatic. Conjunctivae are normal. No scleral icterus. Neck supple.  Cardiovascular: Normal rate, regular rhythm.  Pulmonary/chest: Effort normal and breath sounds normal. No wheezing, rales or rhonchi. Abdominal: Soft, nondistended, nontender.  There are no masses palpable. No hepatomegaly. Extremities: no edema Lymphadenopathy: No cervical adenopathy noted. Neurological: Alert and oriented to person place and time. Skin: Skin is warm and dry. No rashes noted. Psychiatric: Normal mood and affect. Behavior is normal.   ASSESSMENT AND PLAN: 68 year old male here for reassessment of the following:  Barrett's esophagus / GERD - we discussed what Barrett's esophagus is, long-term risks for malignancy.  He has a very short segment and the risk of associated cancer is thought to be quite low.  He will warrant surveillance EGD every 3 to 5 years.  He is next due for endoscopy in 11/2019.  Chronic PPI use is recommended to reduce the risk of malignancy in the setting of Barrett's.  He is tolerating Protonix 20  mg once daily and is controlling his symptoms, we will refill it for the next year.  We discussed risks and benefits of long-term PPI use, he verbalized understanding.  He agreed with the plan.  Will see him next year for follow-up or sooner should he develop any concerning symptoms.  [place] Cellar, MD [facility]

## 2018-08-25 NOTE — Patient Instructions (Addendum)
If you are age 66 or older, your body mass index should be between 23-30. Your Body mass index is 30.17 kg/m. If this is out of the aforementioned range listed, please consider follow up with your Primary Care Provider.  If you are age 66 or younger, your body mass index should be between 19-25. Your Body mass index is 30.17 kg/m. If this is out of the aformentioned range listed, please consider follow up with your Primary Care Provider.   To help prevent the possible spread of infection to our patients, communities, and staff; we will be implementing the following measures:  As of now we are not allowing any visitors/family members to accompany you to any upcoming appointments with [facility]. If you have any concerns about this please contact our office to discuss prior to the appointment.   Continue your Protonix.  We have sent a new prescription to your pharmacy.  You will be due for a EGD in November 2021.   Thank you for entrusting me with your care and for choosing [facility], Dr. Cooter Cellar

## 2018-09-05 ENCOUNTER — Other Ambulatory Visit: Payer: Self-pay

## 2018-09-05 ENCOUNTER — Other Ambulatory Visit (INDEPENDENT_AMBULATORY_CARE_PROVIDER_SITE_OTHER): Payer: BC Managed Care – PPO

## 2018-09-05 DIAGNOSIS — E785 Hyperlipidemia, unspecified: Secondary | ICD-10-CM | POA: Diagnosis not present

## 2018-09-05 LAB — COMPREHENSIVE METABOLIC PANEL
ALT: 18 U/L (ref 0–53)
AST: 16 U/L (ref 0–37)
Albumin: 4.2 g/dL (ref 3.5–5.2)
Alkaline Phosphatase: 43 U/L (ref 39–117)
BUN: 22 mg/dL (ref 6–23)
CO2: 28 mEq/L (ref 19–32)
Calcium: 9.2 mg/dL (ref 8.4–10.5)
Chloride: 105 mEq/L (ref 96–112)
Creatinine, Ser: 1.13 mg/dL (ref 0.40–1.50)
GFR: 64.83 mL/min (ref >60.00)
Glucose, Bld: 95 mg/dL (ref 70–99)
Potassium: 4.3 mEq/L (ref 3.5–5.1)
Sodium: 140 mEq/L (ref 135–145)
Total Bilirubin: 1.1 mg/dL (ref 0.2–1.2)
Total Protein: 6.6 g/dL (ref 6.0–8.3)

## 2018-09-05 LAB — LIPID PANEL
Cholesterol: 135 mg/dL (ref 0–200)
HDL: 45 mg/dL (ref >39.00)
LDL Cholesterol: 68 mg/dL (ref 0–99)
NonHDL: 90.09
Total CHOL/HDL Ratio: 3
Triglycerides: 109 mg/dL (ref 0.0–149.0)
VLDL: 21.8 mg/dL (ref 0.0–40.0)

## 2018-09-07 ENCOUNTER — Other Ambulatory Visit: Payer: Self-pay | Admitting: Gastroenterology

## 2019-01-20 ENCOUNTER — Other Ambulatory Visit: Payer: Self-pay

## 2019-01-20 MED ORDER — PRAVASTATIN SODIUM 20 MG PO TABS: 20.0000 mg | ORAL_TABLET | Freq: Every evening | ORAL | 3 refills | Status: DC

## 2019-02-09 ENCOUNTER — Ambulatory Visit: Payer: BC Managed Care – PPO | Attending: Internal Medicine

## 2019-02-09 DIAGNOSIS — Z23 Encounter for immunization: Secondary | ICD-10-CM

## 2019-02-09 NOTE — Progress Notes (Signed)
   Covid-19 Vaccination Clinic  Name:  Jacob Ray    MRN: [id number] DOB: 22-Jan-1950  02/09/2019  [name] was observed post Covid-19 immunization for 15 minutes without incidence. He was provided with Vaccine Information Sheet and instruction to access the V-Safe system.   [name] was instructed to call 911 with any severe reactions post vaccine: Marland Kitchen Difficulty breathing  . Swelling of your face and throat  . A fast heartbeat  . A bad rash all over your body  . Dizziness and weakness    Immunizations Administered    Name Date Dose VIS Date Route   Pfizer COVID-19 Vaccine 02/09/2019  3:54 PM 0.3 mL 12/12/2018 Intramuscular   Manufacturer: [place]   Lot: [id number]   [place]: [id number]

## 2019-02-27 ENCOUNTER — Other Ambulatory Visit: Payer: Self-pay

## 2019-03-02 ENCOUNTER — Ambulatory Visit (INDEPENDENT_AMBULATORY_CARE_PROVIDER_SITE_OTHER): Payer: BC Managed Care – PPO | Admitting: Internal Medicine

## 2019-03-02 ENCOUNTER — Other Ambulatory Visit: Payer: Self-pay

## 2019-03-02 ENCOUNTER — Encounter: Payer: Self-pay | Admitting: Internal Medicine

## 2019-03-02 VITALS — BP 102/71 | HR 70 | Temp 95.4°F | Resp 16 | Ht 74.0 in | Wt 235.5 lb

## 2019-03-02 DIAGNOSIS — E559 Vitamin D deficiency, unspecified: Secondary | ICD-10-CM | POA: Diagnosis not present

## 2019-03-02 DIAGNOSIS — Z0000 Encounter for general adult medical examination without abnormal findings: Secondary | ICD-10-CM

## 2019-03-02 DIAGNOSIS — L989 Disorder of the skin and subcutaneous tissue, unspecified: Secondary | ICD-10-CM | POA: Diagnosis not present

## 2019-03-02 DIAGNOSIS — R7989 Other specified abnormal findings of blood chemistry: Secondary | ICD-10-CM

## 2019-03-02 LAB — BASIC METABOLIC PANEL
BUN: 20 mg/dL (ref 6–23)
CO2: 30 mEq/L (ref 19–32)
Calcium: 9.5 mg/dL (ref 8.4–10.5)
Chloride: 102 mEq/L (ref 96–112)
Creatinine, Ser: 1.3 mg/dL (ref 0.40–1.50)
GFR: 55.06 mL/min — ABNORMAL LOW (ref >60.00)
Glucose, Bld: 93 mg/dL (ref 70–99)
Potassium: 4.6 mEq/L (ref 3.5–5.1)
Sodium: 137 mEq/L (ref 135–145)

## 2019-03-02 LAB — T4, FREE: Free T4: 0.81 ng/dL (ref 0.60–1.60)

## 2019-03-02 LAB — T3, FREE: T3, Free: 3.4 pg/mL (ref 2.3–4.2)

## 2019-03-02 LAB — PSA: PSA: 0.35 ng/mL (ref 0.10–4.00)

## 2019-03-02 LAB — VITAMIN D 25 HYDROXY (VIT D DEFICIENCY, FRACTURES): VITD: 36.81 ng/mL (ref 30.00–100.00)

## 2019-03-02 LAB — TSH: TSH: 4.04 u[IU]/mL (ref 0.35–4.50)

## 2019-03-02 NOTE — Progress Notes (Signed)
Pre visit review using our clinic review tool, if applicable. No additional management support is needed unless otherwise documented below in the visit note. 

## 2019-03-02 NOTE — Progress Notes (Signed)
Subjective:    Patient ID: Jacob Ray, male    DOB: 1950-09-15, 69 y.o.   MRN: [id number]  DOS:  03/02/2019 Type of visit - description: CPX In general feeling well. Concerned about his family history of dementia, mother, late in life (53s) but he himself feels well   Review of Systems    A 14 point review of systems is negative    Past Medical History:  Diagnosis Date  . Barrett's esophagus    EGD on 05/04/2013, next due in 5/18, [facility]  . BCC (basal cell carcinoma), arm 03/2013   Right forearm  . Blepharitis, right eye 2013  . BPH (benign prostatic hyperplasia)   . GERD (gastroesophageal reflux disease)   . Heart murmur   . History of Helicobacter pylori infection 05/2013  . Mitral regurgitation 2009   mild per ECHO  . Shingles 2013  . Varicose veins    bilateral    Past Surgical History:  Procedure Laterality Date  . LEFT HEART CATH AND CORONARY ANGIOGRAPHY N/A 07/03/2018   Procedure: LEFT HEART CATH AND CORONARY ANGIOGRAPHY;  Surgeon: [name], [name], MD;  Location: [facility];  Service: Cardiovascular;  Laterality: N/A;  . SPIROMETRY  03/18/2013   [facility]  . TONSILLECTOMY    . WISDOM TOOTH EXTRACTION     Family History  Problem Relation Age of Onset  . CAD Father 37  . Hypertension Father   . Prostate cancer Father 83  . Valvular heart disease Father   . Valvular heart disease Brother        76  . CAD Brother        7  . Diabetes Brother   . Obesity Brother   . Colon cancer Neg Hx        F ?    Allergies as of 03/02/2019   No Known Allergies     Medication List       Accurate as of March 02, 2019 11:59 PM. If you have any questions, ask your nurse or doctor.        STOP taking these medications   Vitamin D (Ergocalciferol) 1.25 MG (50000 UNIT) Caps capsule Commonly known as: DRISDOL Stopped by: [name], MD     TAKE these medications   aspirin EC 81 MG tablet Take 81 mg by mouth daily.   multivitamin with  minerals tablet Take 2 tablets by mouth daily. vitafusion   nitroGLYCERIN 0.4 MG SL tablet Commonly known as: NITROSTAT Place 1 tablet (0.4 mg total) under the tongue every 5 (five) minutes x 3 doses as needed for chest pain.   pantoprazole 20 MG tablet Commonly known as: PROTONIX TAKE 1 TABLET BY MOUTH EVERY DAY   pravastatin 20 MG tablet Commonly known as: PRAVACHOL Take 1 tablet (20 mg total) by mouth every evening.             Objective:   Physical Exam BP 102/71 (BP Location: Left Arm, Patient Position: Sitting, Cuff Size: Normal)   Pulse 70   Temp (!) 95.4 F (35.2 C) (Temporal)   Resp 16   Ht 6\' 2"  (1.88 m)   Wt 235 lb 8 oz (106.8 kg)   SpO2 95%   BMI 30.24 kg/m  General: Well developed, NAD, BMI noted Neck: No  thyromegaly  HEENT:  Normocephalic . Face symmetric, atraumatic Lungs:  CTA B Normal respiratory effort, no intercostal retractions, no accessory muscle use. Heart: RRR,  no murmur.  Abdomen:  Not distended, soft, non-tender. No rebound or rigidity.   Lower extremities: no pretibial edema bilaterally  DRE: Normal sphincter tone, normal prostate, brown stools Neurologic:  alert & oriented X3.  Speech normal, gait appropriate for age and unassisted Strength symmetric and appropriate for age.  Psych: Cognition and judgment appear intact.  Cooperative with normal attention span and concentration.  Behavior appropriate. No anxious or depressed appearing.     Assessment     Assessment  GERD --   [name] , s/p EGD x 2, no major findings per pt  Esophageal  stricture, dilatation 07/12/2010 History of Barrett's esophagus, last EGD 05/04/2013: Pathology: Barrett's esophagitis, eosinophilic esophagitis, eosinophilic gastritis, gastric polyp, H. pylori infection. H. pylori testing 11-2015: Negative EGD, [name], 11/2016: + Bx for Barrett's , EGD 3 years. CAD: had CP, cath 07/2018: Mild single-vessel disease.  Medical therapy H/o  BPH, has  seen urology. Skin cancer: BCC per biopsy 03/24/2013  Reportedly mild mitral regurgitation by echo 2009 Remote history of low vitamin D  Shingles 2013 H/o kidney stone ~ 09-2015 per pt report Varicose veins, right ankle  PLAN: Here for CPX CAD: Asymptomatic, last FLP satisfactory. GERD: Saw [name] 08-2018, next follow-up 1 year Vitamin D deficiency: Currently on MVI, not sure if he took ergocalciferol as prescribed a year ago.  Plan: Recheck labs, OTC vitamin D 1000 units daily H/o Skin cancer: At the end of the visit he mentioned to the skin lesions, he showed me 1 the left upper back, likely SK.  Refer to a new dermatologist in [place] for a checkup and to assess the back lesion. Increased TSH: Check TFTs RTC 6 months  This visit occurred during the SARS-CoV-2 public health emergency.  Safety protocols were in place, including screening questions prior to the visit, additional usage of staff PPE, and extensive cleaning of exam room while observing appropriate contact time as indicated for disinfecting solutions.

## 2019-03-02 NOTE — Patient Instructions (Addendum)
GO TO THE LAB : Get the blood work     GO TO [place] back for a checkup in 6 months, please make an appointment   O-T-C  vitamin D3: 1000 units daily

## 2019-03-03 NOTE — Assessment & Plan Note (Signed)
-  Td 2016 - had a flushot -  Shingrx discussed before  - PNM 23: 2020 -Prevnar at the next opportunity, I hope he gets a Covid shot soon. -CCS:   [name]: Colonoscopy   05-2010, 05/18/2013, inadequate prep, no polyp, no BX, repeat in 5 years. Colonoscopy 11-2016 at [place]: No polyps, internal hemorrhoids, 10 years -Prostate ca screening: + FH,  DRE normal today, no symptoms, check a PSA. -Diet discussed  -Labs reviewed, check a  BMP, TFTs, vitamin D, PSA

## 2019-03-03 NOTE — Assessment & Plan Note (Signed)
Here for CPX CAD: Asymptomatic, last FLP satisfactory. GERD: Saw [name] 08-2018, next follow-up 1 year Vitamin D deficiency: Currently on MVI, not sure if he took ergocalciferol as prescribed a year ago.  Plan: Recheck labs, OTC vitamin D 1000 units daily H/o Skin cancer: At the end of the visit he mentioned to the skin lesions, he showed me 1 the left upper back, likely SK.  Refer to a new dermatologist in [place] for a checkup and to assess the back lesion. Increased TSH: Check TFTs RTC 6 months

## 2019-03-04 ENCOUNTER — Other Ambulatory Visit: Payer: Self-pay | Admitting: Gastroenterology

## 2019-03-09 ENCOUNTER — Ambulatory Visit: Payer: BC Managed Care – PPO | Attending: Internal Medicine

## 2019-03-09 DIAGNOSIS — Z23 Encounter for immunization: Secondary | ICD-10-CM | POA: Insufficient documentation

## 2019-03-09 NOTE — Progress Notes (Signed)
   Covid-19 Vaccination Clinic  Name:  Jacob Ray    MRN: [id number] DOB: 04-03-1950  03/09/2019  [name] was observed post Covid-19 immunization for 15 minutes without incident. He was provided with Vaccine Information Sheet and instruction to access the V-Safe system.   [name] was instructed to call 911 with any severe reactions post vaccine: Marland Kitchen Difficulty breathing  . Swelling of face and throat  . A fast heartbeat  . A bad rash all over body  . Dizziness and weakness   Immunizations Administered    Name Date Dose VIS Date Route   Pfizer COVID-19 Vaccine 03/09/2019  8:28 AM 0.3 mL 12/12/2018 Intramuscular   Manufacturer: [place]   Lot: [id number]   [place]: [id number]

## 2019-04-17 ENCOUNTER — Encounter: Payer: Self-pay | Admitting: Internal Medicine

## 2019-04-17 DIAGNOSIS — L814 Other melanin hyperpigmentation: Secondary | ICD-10-CM | POA: Diagnosis not present

## 2019-04-17 DIAGNOSIS — C44519 Basal cell carcinoma of skin of other part of trunk: Secondary | ICD-10-CM | POA: Diagnosis not present

## 2019-04-17 DIAGNOSIS — Z85828 Personal history of other malignant neoplasm of skin: Secondary | ICD-10-CM | POA: Diagnosis not present

## 2019-04-17 DIAGNOSIS — D2261 Melanocytic nevi of right upper limb, including shoulder: Secondary | ICD-10-CM | POA: Diagnosis not present

## 2019-04-17 DIAGNOSIS — L820 Inflamed seborrheic keratosis: Secondary | ICD-10-CM | POA: Diagnosis not present

## 2019-04-17 DIAGNOSIS — L821 Other seborrheic keratosis: Secondary | ICD-10-CM | POA: Diagnosis not present

## 2019-04-27 DIAGNOSIS — C4491 Basal cell carcinoma of skin, unspecified: Secondary | ICD-10-CM | POA: Insufficient documentation

## 2019-06-16 DIAGNOSIS — K047 Periapical abscess without sinus: Secondary | ICD-10-CM | POA: Diagnosis not present

## 2019-09-04 ENCOUNTER — Ambulatory Visit: Payer: BC Managed Care – PPO | Admitting: Internal Medicine

## 2019-10-14 ENCOUNTER — Ambulatory Visit: Payer: BC Managed Care – PPO | Admitting: Internal Medicine

## 2019-11-02 ENCOUNTER — Telehealth: Payer: Self-pay

## 2019-11-02 ENCOUNTER — Ambulatory Visit (INDEPENDENT_AMBULATORY_CARE_PROVIDER_SITE_OTHER): Payer: BC Managed Care – PPO | Admitting: Internal Medicine

## 2019-11-02 ENCOUNTER — Encounter: Payer: Self-pay | Admitting: Internal Medicine

## 2019-11-02 ENCOUNTER — Ambulatory Visit: Payer: BC Managed Care – PPO | Attending: Internal Medicine

## 2019-11-02 ENCOUNTER — Other Ambulatory Visit: Payer: Self-pay

## 2019-11-02 ENCOUNTER — Other Ambulatory Visit (HOSPITAL_BASED_OUTPATIENT_CLINIC_OR_DEPARTMENT_OTHER): Payer: Self-pay | Admitting: Internal Medicine

## 2019-11-02 VITALS — BP 123/84 | HR 70 | Temp 97.7°F | Resp 16 | Ht 74.0 in | Wt 232.4 lb

## 2019-11-02 DIAGNOSIS — K22719 Barrett's esophagus with dysplasia, unspecified: Secondary | ICD-10-CM

## 2019-11-02 DIAGNOSIS — Z23 Encounter for immunization: Secondary | ICD-10-CM

## 2019-11-02 DIAGNOSIS — I2510 Atherosclerotic heart disease of native coronary artery without angina pectoris: Secondary | ICD-10-CM | POA: Diagnosis not present

## 2019-11-02 DIAGNOSIS — E785 Hyperlipidemia, unspecified: Secondary | ICD-10-CM | POA: Diagnosis not present

## 2019-11-02 DIAGNOSIS — R7989 Other specified abnormal findings of blood chemistry: Secondary | ICD-10-CM

## 2019-11-02 NOTE — Progress Notes (Signed)
Pre visit review using our clinic review tool, if applicable. No additional management support is needed unless otherwise documented below in the visit note. 

## 2019-11-02 NOTE — Progress Notes (Signed)
Subjective:    Patient ID: Jacob Ray, male    DOB: January 11, 1950, 69 y.o.   MRN: [id number]  DOS:  11/02/2019 Type of visit - description: Routine follow-up Since the last office visit he is doing well. He decided to stop  aspirin Saw dermatology, biopsy results reviewed.    Review of Systems No fever chills No chest pain or difficulty breathing No acid reflux, dysphagia or odynophagia  Past Medical History:  Diagnosis Date  . Barrett's esophagus    EGD on 05/04/2013, next due in 5/18, [facility]  . BCC (basal cell carcinoma), arm 03/2013   Right forearm  . Blepharitis, right eye 2013  . BPH (benign prostatic hyperplasia)   . GERD (gastroesophageal reflux disease)   . Heart murmur   . History of Helicobacter pylori infection 05/2013  . Mitral regurgitation 2009   mild per ECHO  . Shingles 2013  . Varicose veins    bilateral    Past Surgical History:  Procedure Laterality Date  . LEFT HEART CATH AND CORONARY ANGIOGRAPHY N/A 07/03/2018   Procedure: LEFT HEART CATH AND CORONARY ANGIOGRAPHY;  Surgeon: [name], [name], MD;  Location: [facility];  Service: Cardiovascular;  Laterality: N/A;  . SPIROMETRY  03/18/2013   [facility]  . TONSILLECTOMY    . WISDOM TOOTH EXTRACTION      Allergies as of 11/02/2019   No Known Allergies     Medication List       Accurate as of November 02, 2019 11:59 PM. If you have any questions, ask your nurse or doctor.        aspirin EC 81 MG tablet Take 81 mg by mouth daily.   multivitamin with minerals tablet Take 2 tablets by mouth daily. vitafusion   nitroGLYCERIN 0.4 MG SL tablet Commonly known as: NITROSTAT Place 1 tablet (0.4 mg total) under the tongue every 5 (five) minutes x 3 doses as needed for chest pain.   pantoprazole 20 MG tablet Commonly known as: PROTONIX TAKE 1 TABLET BY MOUTH EVERY DAY   pravastatin 20 MG tablet Commonly known as: PRAVACHOL Take 1 tablet (20 mg total) by mouth every  evening.          Objective:   Physical Exam BP 123/84 (BP Location: Left Arm, Patient Position: Sitting, Cuff Size: Normal)   Pulse 70   Temp 97.7 F (36.5 C) (Oral)   Resp 16   Ht 6\' 2"  (1.88 m)   Wt 232 lb 6 oz (105.4 kg)   SpO2 97%   BMI 29.84 kg/m  General:   Well developed, NAD, BMI noted. HEENT:  Normocephalic . Face symmetric, atraumatic Lungs:  CTA B Normal respiratory effort, no intercostal retractions, no accessory muscle use. Heart: RRR,  no murmur.  Lower extremities: no pretibial edema bilaterally  Skin: Not pale. Not jaundice Neurologic:  alert & oriented X3.  Speech normal, gait appropriate for age and unassisted Psych--  Cognition and judgment appear intact.  Cooperative with normal attention span and concentration.  Behavior appropriate. No anxious or depressed appearing.      Assessment     Assessment  GERD --   [name] , s/p EGD x 2, no major findings per pt  Esophageal  stricture, dilatation 07/12/2010 History of Barrett's esophagus, last EGD 05/04/2013: Pathology: Barrett's esophagitis, eosinophilic esophagitis, eosinophilic gastritis, gastric polyp, H. pylori infection. H. pylori testing 11-2015: Negative EGD, [name], 11/2016: + Bx for Barrett's , EGD 3 years. CAD:  had CP, cath 07/2018: Mild single-vessel disease.  Medical therapy H/o  BPH, has seen urology. Skin cancer: BCC per biopsy 03/24/2013  Reportedly mild mitral regurgitation by echo 2009 Remote history of low vitamin D  Shingles 2013 H/o kidney stone ~ 09-2015 per pt report Varicose veins, right ankle  PLAN: GERD, history of Barrett's: Due for EGD, will send a message to GI.  Check CBC CAD: Stopped aspirin, recommend to restart.  Asx High cholesterol: On Pravachol, not fasting, check FLP, CMP Skin cancer: Since last visit had another [facility] removal April 2021.  Plans to see Derm yearly. Increased TSH: Check a TSH and free T4. Preventive care: Flu shot today, plans to  get Covid booster. RTC CPX 4 months   This visit occurred during the SARS-CoV-2 public health emergency.  Safety protocols were in place, including screening questions prior to the visit, additional usage of staff PPE, and extensive cleaning of exam room while observing appropriate contact time as indicated for disinfecting solutions.

## 2019-11-02 NOTE — Telephone Encounter (Signed)
-----   Message from Yetta Flock, MD sent at 11/02/2019 11:21 AM EDT ----- Regarding: FW: EGD Can you help schedule this patient for routine EGD at [facility] for Barrett's? Thanks  Dr. Loni Muse ----- Message ----- From: Colon Branch, MD Sent: 11/02/2019   8:51 AM EDT To: Yetta Flock, MD Subject: EGD                                            Remo Lipps, the patient is ready for a f/u EGD when you are.Jos

## 2019-11-02 NOTE — Telephone Encounter (Signed)
Spoke with patient, he has been scheduled for a pre-visit on 11/17/19 at 4:30 PM. EGD is scheduled for 11/24/19 at 4 PM, arrival time 3 PM. Patient is aware of appointments and had no concerns at the end of the call.

## 2019-11-02 NOTE — Progress Notes (Signed)
   Covid-19 Vaccination Clinic  Name:  Jacob Ray    MRN: [id number] DOB: Aug 13, 1950  11/02/2019  [name] was observed post Covid-19 immunization for 15 minutes without incident. He was provided with Vaccine Information Sheet and instruction to access the V-Safe system.   [name] was instructed to call 911 with any severe reactions post vaccine: Marland Kitchen Difficulty breathing  . Swelling of face and throat  . A fast heartbeat  . A bad rash all over body  . Dizziness and weakness

## 2019-11-02 NOTE — Patient Instructions (Addendum)
Go back on aspirin 81 mg daily   GO TO THE LAB : Get the blood work     GO TO THE FRONT DESK, [place] Come back for   a physical exam in 4 months

## 2019-11-03 LAB — CBC WITH DIFFERENTIAL/PLATELET
Absolute Monocytes: 456 cells/uL (ref 200–950)
Basophils Absolute: 32 cells/uL (ref 0–200)
Basophils Relative: 0.8 %
Eosinophils Absolute: 100 cells/uL (ref 15–500)
Eosinophils Relative: 2.5 %
HCT: 45.8 % (ref 38.5–50.0)
Hemoglobin: 15.5 g/dL (ref 13.2–17.1)
Lymphs Abs: 1328 cells/uL (ref 850–3900)
MCH: 31.6 pg (ref 27.0–33.0)
MCHC: 33.8 g/dL (ref 32.0–36.0)
MCV: 93.3 fL (ref 80.0–100.0)
MPV: 10.4 fL (ref 7.5–12.5)
Monocytes Relative: 11.4 %
Neutro Abs: 2084 cells/uL (ref 1500–7800)
Neutrophils Relative %: 52.1 %
Platelets: 236 10*3/uL (ref 140–400)
RBC: 4.91 10*6/uL (ref 4.20–5.80)
RDW: 12 % (ref 11.0–15.0)
Total Lymphocyte: 33.2 %
WBC: 4 10*3/uL (ref 3.8–10.8)

## 2019-11-03 LAB — COMPREHENSIVE METABOLIC PANEL
AG Ratio: 1.6 (calc) (ref 1.0–2.5)
ALT: 15 U/L (ref 9–46)
AST: 13 U/L (ref 10–35)
Albumin: 4.2 g/dL (ref 3.6–5.1)
Alkaline phosphatase (APISO): 50 U/L (ref 35–144)
BUN/Creatinine Ratio: 16 (calc) (ref 6–22)
BUN: 20 mg/dL (ref 7–25)
CO2: 27 mmol/L (ref 20–32)
Calcium: 9.8 mg/dL (ref 8.6–10.3)
Chloride: 103 mmol/L (ref 98–110)
Creat: 1.28 mg/dL — ABNORMAL HIGH (ref 0.70–1.25)
Globulin: 2.6 g/dL (calc) (ref 1.9–3.7)
Glucose, Bld: 100 mg/dL — ABNORMAL HIGH (ref 65–99)
Potassium: 5 mmol/L (ref 3.5–5.3)
Sodium: 138 mmol/L (ref 135–146)
Total Bilirubin: 1 mg/dL (ref 0.2–1.2)
Total Protein: 6.8 g/dL (ref 6.1–8.1)

## 2019-11-03 LAB — LIPID PANEL
Cholesterol: 138 mg/dL (ref <200)
HDL: 44 mg/dL (ref >=40)
LDL Cholesterol (Calc): 67 mg/dL (calc)
Non-HDL Cholesterol (Calc): 94 mg/dL (calc) (ref <130)
Total CHOL/HDL Ratio: 3.1 (calc) (ref <5.0)
Triglycerides: 199 mg/dL — ABNORMAL HIGH (ref <150)

## 2019-11-03 LAB — TSH: TSH: 2.76 mIU/L (ref 0.40–4.50)

## 2019-11-03 LAB — T4, FREE: Free T4: 1.2 ng/dL (ref 0.8–1.8)

## 2019-11-03 NOTE — Assessment & Plan Note (Signed)
GERD, history of Barrett's: Due for EGD, will send a message to GI.  Check CBC CAD: Stopped aspirin, recommend to restart.  Asx High cholesterol: On Pravachol, not fasting, check FLP, CMP Skin cancer: Since last visit had another [facility] removal April 2021.  Plans to see Derm yearly. Increased TSH: Check a TSH and free T4. Preventive care: Flu shot today, plans to get Covid booster. RTC CPX 4 months

## 2019-11-06 MED FILL — PFIZER-BIONTECH COVID-19 VA: 30 | 1 days supply | Qty: 0 | Fill #0

## 2019-11-09 ENCOUNTER — Other Ambulatory Visit: Payer: Self-pay

## 2019-11-09 ENCOUNTER — Ambulatory Visit (AMBULATORY_SURGERY_CENTER): Payer: Self-pay | Admitting: *Deleted

## 2019-11-09 VITALS — Ht 74.0 in | Wt 230.0 lb

## 2019-11-09 DIAGNOSIS — K2270 Barrett's esophagus without dysplasia: Secondary | ICD-10-CM

## 2019-11-09 NOTE — Progress Notes (Signed)
No egg or soy allergy known to patient  No issues with past sedation with any surgeries or procedures no intubation problems in the past  No FH of Malignant Hyperthermia No diet pills per patient No home 02 use per patient  No blood thinners per patient  Pt denies issues with constipation  No A fib or A flutter  EMMI video to pt or via MyChart  COVID 19 guidelines implemented in [place] today with Pt and RN    cov vax x 3  Due to the COVID-19 pandemic we are asking patients to follow these guidelines. Please only bring one care partner. Please be aware that your care partner may wait in the car in the parking lot or if they feel like they will be too hot to wait in the car, they may wait in the lobby on the 4th floor. All care partners are required to wear a mask the entire time (we do not have any that we can provide them), they need to practice social distancing, and we will do a Covid check for all patient's and care partners when you arrive. Also we will check their temperature and your temperature. If the care partner waits in their car they need to stay in the parking lot the entire time and we will call them on their cell phone when the patient is ready for discharge so they can bring the car to the front of the building. Also all patient's will need to wear a mask into building.

## 2019-11-11 ENCOUNTER — Encounter: Payer: Self-pay | Admitting: Gastroenterology

## 2019-11-24 ENCOUNTER — Other Ambulatory Visit: Payer: Self-pay | Admitting: Gastroenterology

## 2019-11-24 ENCOUNTER — Other Ambulatory Visit: Payer: Self-pay

## 2019-11-24 ENCOUNTER — Encounter: Payer: Self-pay | Admitting: Gastroenterology

## 2019-11-24 ENCOUNTER — Ambulatory Visit (AMBULATORY_SURGERY_CENTER): Payer: BC Managed Care – PPO | Admitting: Gastroenterology

## 2019-11-24 VITALS — BP 116/79 | HR 60 | Temp 97.6°F | Resp 21 | Ht 74.0 in | Wt 230.0 lb

## 2019-11-24 DIAGNOSIS — K319 Disease of stomach and duodenum, unspecified: Secondary | ICD-10-CM | POA: Diagnosis not present

## 2019-11-24 DIAGNOSIS — K2970 Gastritis, unspecified, without bleeding: Secondary | ICD-10-CM

## 2019-11-24 DIAGNOSIS — K317 Polyp of stomach and duodenum: Secondary | ICD-10-CM | POA: Diagnosis not present

## 2019-11-24 DIAGNOSIS — K449 Diaphragmatic hernia without obstruction or gangrene: Secondary | ICD-10-CM | POA: Diagnosis not present

## 2019-11-24 DIAGNOSIS — K2270 Barrett's esophagus without dysplasia: Secondary | ICD-10-CM

## 2019-11-24 MED ORDER — SODIUM CHLORIDE 0.9 % IV SOLN: 500.0000 mL | INTRAVENOUS | Status: DC

## 2019-11-24 NOTE — Patient Instructions (Signed)
YOU HAD AN ENDOSCOPIC PROCEDURE TODAY AT [facility]:   Refer to the procedure report that was given to you for any specific questions about what was found during the examination.  If the procedure report does not answer your questions, please call your gastroenterologist to clarify.  If you requested that your care partner not be given the details of your procedure findings, then the procedure report has been included in a sealed envelope for you to review at your convenience later.  YOU SHOULD EXPECT: Some feelings of bloating in the abdomen. Passage of more gas than usual.  Walking can help get rid of the air that was put into your GI tract during the procedure and reduce the bloating. If you had a lower endoscopy (such as a colonoscopy or flexible sigmoidoscopy) you may notice spotting of blood in your stool or on the toilet paper. If you underwent a bowel prep for your procedure, you may not have a normal bowel movement for a few days.  Please Note:  You might notice some irritation and congestion in your nose or some drainage.  This is from the oxygen used during your procedure.  There is no need for concern and it should clear up in a day or so.  SYMPTOMS TO REPORT IMMEDIATELY:    Following upper endoscopy (EGD)  Vomiting of blood or coffee ground material  New chest pain or pain under the shoulder blades  Painful or persistently difficult swallowing  New shortness of breath  Fever of 100F or higher  Black, tarry-looking stools  For urgent or emergent issues, a gastroenterologist can be reached at any hour by calling [phone]. Do not use MyChart messaging for urgent concerns.    DIET:  We do recommend a small meal at first, but then you may proceed to your regular diet.  Drink plenty of fluids but you should avoid alcoholic beverages for 24 hours.  MEDICATIONS: Continue present medications.  ACTIVITY:  You should plan to take it easy for the rest of today and  you should NOT DRIVE or use heavy machinery until tomorrow (because of the sedation medicines used during the test).    FOLLOW UP: Our staff will call the number listed on your records 48-72 hours following your procedure to check on you and address any questions or concerns that you may have regarding the information given to you following your procedure. If we do not reach you, we will leave a message.  We will attempt to reach you two times.  During this call, we will ask if you have developed any symptoms of COVID 19. If you develop any symptoms (ie: fever, flu-like symptoms, shortness of breath, cough etc.) before then, please call [phone].  If you test positive for Covid 19 in the 2 weeks post procedure, please call and report this information to [place].    If any biopsies were taken you will be contacted by phone or by letter within the next 1-3 weeks.  Please call us at [phone] if you have not heard about the biopsies in 3 weeks.   Thank you for allowing [place] to provide for your healthcare needs today.   SIGNATURES/CONFIDENTIALITY: You and/or your care partner have signed paperwork which will be entered into your electronic medical record.  These signatures attest to the fact that that the information above on your After Visit Summary has been reviewed and is understood.  Full responsibility of the confidentiality of this discharge information lies  with you and/or your care-partner.

## 2019-11-24 NOTE — Op Note (Signed)
[facility] Patient Name: Jacob Ray Procedure Date: 11/24/2019 3:26 PM MRN: [id number] Endoscopist: Remo Lipps P. [name] , MD Age: 69 Referring MD:  Date of Birth: 02/22/1950 Gender: Male Account #: [id number] Procedure:                Upper GI endoscopy Indications:              history of Barrett's esophagus, on protonix 20mg  /                            day with good control of symptoms Medicines:                Monitored Anesthesia Care Procedure:                Pre-Anesthesia Assessment:                           - Prior to the procedure, a History and Physical                            was performed, and patient medications and                            allergies were reviewed. The patient's tolerance of                            previous anesthesia was also reviewed. The risks                            and benefits of the procedure and the sedation                            options and risks were discussed with the patient.                            All questions were answered, and informed consent                            was obtained. Prior Anticoagulants: The patient has                            taken no previous anticoagulant or antiplatelet                            agents. ASA Grade Assessment: II - A patient with                            mild systemic disease. After reviewing the risks                            and benefits, the patient was deemed in                            satisfactory condition to undergo the procedure.  After obtaining informed consent, the endoscope was                            passed under direct vision. Throughout the                            procedure, the patient's blood pressure, pulse, and                            oxygen saturations were monitored continuously. The                            Endoscope was introduced through the mouth, and                            advanced to the  second part of duodenum. The upper                            GI endoscopy was accomplished without difficulty.                            The patient tolerated the procedure well. Scope In: Scope Out: Findings:                 Esophagogastric landmarks were identified: the                            Z-line was found at 40 cm, the gastroesophageal                            junction was found at 40 cm and the upper extent of                            the gastric folds was found at 42 cm from the                            incisors.                           A 2 cm hiatal hernia was present.                           The Z-line was irregular and was found 40 cm from                            the incisors, irregular roughly 8-55mm extensions                            of salmon colored mucosa. Biopsies were taken with                            a cold forceps for histology.  The exam of the esophagus was otherwise normal.                           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                           The exam of the stomach was otherwise normal.                           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 The duodenal bulb and second portion                            of the duodenum were otherwise normal. Complications:            No immediate complications. Estimated blood loss:                            Minimal. Estimated Blood Loss:     Estimated blood loss was minimal. Impression:               - Esophagogastric landmarks identified.                           - 2 cm hiatal hernia.                           - Z-line irregular, 40 cm from the incisors,                            grossly consistent with a very short segment of                             Barrett's. Biopsied.                           - Multiple small benign appearing gastric polyps,                            likely fundic gland polyps.                           - Normal duodenal bulb and second portion of the                            duodenum. Recommendation:           - Patient has a contact number available for  emergencies. The signs and symptoms of potential                            delayed complications were discussed with the                            patient. Return to normal activities tomorrow.                            Written discharge instructions were provided to the                            patient.                           - Resume previous diet.                           - Continue present medications.                           - Await pathology results. Remo Lipps P. Gurtaj Ruz, MD 11/24/2019 3:47:04 PM This report has been signed electronically.

## 2019-11-24 NOTE — Progress Notes (Signed)
pt tolerated well. VSS. awake and to recovery. Report given to RN. Bite block inserted and removed without trauma. 

## 2019-11-24 NOTE — Progress Notes (Signed)
Called to room to assist during endoscopic procedure.  Patient ID and intended procedure confirmed with present staff. Received instructions for my participation in the procedure from the performing physician.  

## 2019-11-25 ENCOUNTER — Telehealth: Payer: Self-pay | Admitting: *Deleted

## 2019-11-25 NOTE — Telephone Encounter (Signed)
First attempt, left VM.  

## 2019-12-23 ENCOUNTER — Telehealth: Payer: Self-pay | Admitting: Gastroenterology

## 2019-12-24 MED ORDER — PANTOPRAZOLE SODIUM 20 MG PO TBEC
20.0000 mg | DELAYED_RELEASE_TABLET | Freq: Every day | ORAL | 1 refills | Status: DC
Start: 2019-12-24 — End: 2021-01-16

## 2019-12-24 NOTE — Telephone Encounter (Signed)
Prescription sent to patient's pharmacy.

## 2020-01-19 ENCOUNTER — Other Ambulatory Visit: Payer: Self-pay | Admitting: Cardiology

## 2020-01-20 NOTE — Telephone Encounter (Signed)
Refill sent to pharmacy.   

## 2020-03-03 ENCOUNTER — Ambulatory Visit (INDEPENDENT_AMBULATORY_CARE_PROVIDER_SITE_OTHER): Payer: BC Managed Care – PPO | Admitting: Internal Medicine

## 2020-03-03 ENCOUNTER — Other Ambulatory Visit: Payer: Self-pay

## 2020-03-03 ENCOUNTER — Encounter: Payer: Self-pay | Admitting: Internal Medicine

## 2020-03-03 VITALS — BP 108/76 | HR 66 | Temp 97.7°F | Resp 16 | Ht 74.0 in | Wt 231.5 lb

## 2020-03-03 DIAGNOSIS — R7989 Other specified abnormal findings of blood chemistry: Secondary | ICD-10-CM | POA: Diagnosis not present

## 2020-03-03 DIAGNOSIS — Z23 Encounter for immunization: Secondary | ICD-10-CM | POA: Diagnosis not present

## 2020-03-03 DIAGNOSIS — Z0000 Encounter for general adult medical examination without abnormal findings: Secondary | ICD-10-CM

## 2020-03-03 LAB — BASIC METABOLIC PANEL
BUN: 18 mg/dL (ref 6–23)
CO2: 28 mEq/L (ref 19–32)
Calcium: 9.3 mg/dL (ref 8.4–10.5)
Chloride: 106 mEq/L (ref 96–112)
Creatinine, Ser: 1.1 mg/dL (ref 0.40–1.50)
GFR: 69.23 mL/min (ref >60.00)
Glucose, Bld: 86 mg/dL (ref 70–99)
Potassium: 4.8 mEq/L (ref 3.5–5.1)
Sodium: 139 mEq/L (ref 135–145)

## 2020-03-03 LAB — PSA: PSA: 0.3 ng/mL (ref 0.10–4.00)

## 2020-03-03 LAB — TSH: TSH: 2.88 u[IU]/mL (ref 0.35–4.50)

## 2020-03-03 NOTE — Progress Notes (Signed)
Pre visit review using our clinic review tool, if applicable. No additional management support is needed unless otherwise documented below in the visit note. 

## 2020-03-03 NOTE — Assessment & Plan Note (Signed)
-  Td 2016 - PNM 23: 2020 -Prevnar: Discuss at the next physical -Shingrix: #1 today, come back into 3 months -COVID VAX x3 -Had a flu shot -CCS: Dr Peters:Colonoscopy   [phone], 05/18/2013, inadequate prep, no polyp, no BX, repeat in 5 years. Colonoscopy 11-2016 at [place]: No polyps, internal hemorrhoids, 10 years -Prostate ca screening: + FH, DRE and PSA normal 2021, check a PSA. -Doing well with diet, exercise. -Still chews tobacco, very small amounts, not inclined to stop, sees these dentist regularly -Labs:BMP, PSA, TSH

## 2020-03-03 NOTE — Assessment & Plan Note (Signed)
Here for CPX GERD: Recently had an upper endoscopy.  Was told okay, on PPIs CAD: asx, control CV RF High cholesterol: On Pravachol, last LDL 67. RTC 6 to 8 months

## 2020-03-03 NOTE — Patient Instructions (Addendum)
Happy Early Rudene Anda!  GO TO THE LAB : Get the blood work    GO TO THE FRONT DESK, [place] back for your second shingles shot in 2 3 months Come back for a checkup in 6 to 8 months, fasting     Advance Directive  Advance directives are legal documents that allow you to make decisions about your health care and medical treatment in case you become unable to communicate for yourself. Advance directives let your wishes be known to family, friends, and health care providers. Discussing and writing advance directives should happen over time rather than all at once. Advance directives can be changed and updated at any time. There are different types of advance directives, such as:  Medical power of attorney.  Living will.  Do not resuscitate (DNR) order or do not attempt resuscitation (DNAR) order. Health care proxy and medical power of attorney A health care proxy is also called a health care agent. This person is appointed to make medical decisions for you when you are unable to make decisions for yourself. Generally, people ask a trusted friend or family member to act as their proxy and represent their preferences. Make sure you have an agreement with your trusted person to act as your proxy. A proxy may have to make a medical decision on your behalf if your wishes are not known. A medical power of attorney, also called a durable power of attorney for health care, is a legal document that names your health care proxy. Depending on the laws in your state, the document may need to be:  Signed.  Notarized.  Dated.  Copied.  Witnessed.  Incorporated into your medical record. You may also want to appoint a trusted person to manage your money in the event you are unable to do so. This is called a durable power of attorney for finances. It is a separate legal document from the durable power of attorney for health care. You may choose your health care proxy or  someone different to act as your agent in money matters. If you do not appoint a proxy, or there is a concern that the proxy is not acting in your best interest, a court may appoint a guardian to act on your behalf. Living will A living will is a set of instructions that state your wishes about medical care when you cannot express them yourself. Health care providers should keep a copy of your living will in your medical record. You may want to give a copy to family members or friends. To alert caregivers in case of an emergency, you can place a card in your wallet to let them know that you have a living will and where they can find it. A living will is used if you become:  Terminally ill.  Disabled.  Unable to communicate or make decisions. The following decisions should be included in your living will:  To use or not to use life support equipment, such as dialysis machines and breathing machines (ventilators).  Whether you want a DNR or DNAR order. This tells health care providers not to use cardiopulmonary resuscitation (CPR) if breathing or heartbeat stops.  To use or not to use tube feeding.  To be given or not to be given food and fluids.  Whether you want comfort (palliative) care when the goal becomes comfort rather than a cure.  Whether you want to donate your organs and tissues. A living will does not give instructions  for distributing your money and property if you should pass away. DNR or DNAR A DNR or DNAR order is a request not to have CPR in the event that your heart stops beating or you stop breathing. If a DNR or DNAR order has not been made and shared, a health care provider will try to help any patient whose heart has stopped or who has stopped breathing. If you plan to have surgery, talk with your health care provider about how your DNR or DNAR order will be followed if problems occur. What if I do not have an advance directive? Some states assign family decision makers  to act on your behalf if you do not have an advance directive. Each state has its own laws about advance directives. You may want to check with your health care provider, attorney, or state representative about the laws in your state. Summary  Advance directives are legal documents that allow you to make decisions about your health care and medical treatment in case you become unable to communicate for yourself.  The process of discussing and writing advance directives should happen over time. You can change and update advance directives at any time.  Advance directives may include a medical power of attorney, a living will, and a DNR or DNAR order. This information is not intended to replace advice given to you by your health care provider. Make sure you discuss any questions you have with your health care provider. Document Revised: 09/22/2019 Document Reviewed: 09/22/2019 Elsevier Patient Education  2021 [employer]. Was told okay.

## 2020-03-03 NOTE — Progress Notes (Signed)
Subjective:    Patient ID: Jacob Ray, male    DOB: Mar 28, 1950, 70 y.o.   MRN: [id number]  DOS:  03/03/2020 Type of visit - description: CPX  Feels well, has no concerns  Review of Systems  Other than above, a 14 point review of systems is negative      Past Medical History:  Diagnosis Date  . Barrett's esophagus    EGD on 05/04/2013, next due in 5/18, [facility]  . BCC (basal cell carcinoma), arm 03/2013   Right forearm  . Blepharitis, right eye 2013  . BPH (benign prostatic hyperplasia)   . GERD (gastroesophageal reflux disease)   . Heart murmur   . History of Helicobacter pylori infection 05/2013  . Mitral regurgitation 2009   mild per ECHO  . Shingles 2013  . Varicose veins    bilateral    Past Surgical History:  Procedure Laterality Date  . COLONOSCOPY    . LEFT HEART CATH AND CORONARY ANGIOGRAPHY N/A 07/03/2018   Procedure: LEFT HEART CATH AND CORONARY ANGIOGRAPHY;  Surgeon: [name], [name], MD;  Location: [place] [facility];  Service: Cardiovascular;  Laterality: N/A;  . SPIROMETRY  03/18/2013   [facility]  . TONSILLECTOMY    . UPPER GASTROINTESTINAL ENDOSCOPY    . WISDOM TOOTH EXTRACTION      Allergies as of 03/03/2020   No Known Allergies     Medication List       Accurate as of March 03, 2020  2:07 PM. If you have any questions, ask your nurse or doctor.        aspirin EC 81 MG tablet Take 81 mg by mouth daily.   multivitamin with minerals tablet Take 2 tablets by mouth daily. vitafusion   nitroGLYCERIN 0.4 MG SL tablet Commonly known as: NITROSTAT Place 1 tablet (0.4 mg total) under the tongue every 5 (five) minutes x 3 doses as needed for chest pain.   pantoprazole 20 MG tablet Commonly known as: PROTONIX Take 1 tablet (20 mg total) by mouth daily.   pravastatin 20 MG tablet Commonly known as: PRAVACHOL Take 1 tablet (20 mg total) by mouth daily. NEEDS AN APPOINTMENT FOR FURTHER REFILLS, FIRST ATTEMPT           Objective:   Physical Exam BP 108/76 (BP Location: Left Arm, Patient Position: Sitting, Cuff Size: Normal)   Pulse 66   Temp 97.7 F (36.5 C) (Oral)   Resp 16   Ht 6\' 2"  (1.88 m)   Wt 231 lb 8 oz (105 kg)   SpO2 97%   BMI 29.72 kg/m  General: Well developed, NAD, BMI noted Neck: No  thyromegaly  HEENT:  Normocephalic . Face symmetric, atraumatic Lungs:  CTA B Normal respiratory effort, no intercostal retractions, no accessory muscle use. Heart: RRR,  no murmur.  Abdomen:  Not distended, soft, non-tender. No rebound or rigidity.   Lower extremities: no pretibial edema bilaterally  Skin: Exposed areas without rash. Not pale. Not jaundice Neurologic:  alert & oriented X3.  Speech normal, gait appropriate for age and unassisted Strength symmetric and appropriate for age.  Psych: Cognition and judgment appear intact.  Cooperative with normal attention span and concentration.  Behavior appropriate. No anxious or depressed appearing.     Assessment     Assessment  GERD --   [name] , s/p EGD x 2, no major findings per pt  Esophageal  stricture, dilatation 07/12/2010  Barrett's esophagus, last EGD 05/04/2013: Pathology:  Barrett's esophagitis, eosinophilic esophagitis, eosinophilic gastritis, gastric polyp, H. pylori infection. H. pylori testing 11-2015: Negative EGD 2018: + Bx for Barrett's EGD: 11-2019, next per GI CAD: had CP, cath 07/2018: Mild single-vessel disease.  Medical therapy H/o  BPH, has seen urology. Skin cancer: BCC per biopsy 03/24/2013  Reportedly mild mitral regurgitation by echo 2009 Vitamin D deficiency Shingles 2013 H/o kidney stone ~ 09-2015 per pt report Varicose veins, right ankle  PLAN: Here for CPX GERD: Recently had an upper endoscopy.  Was told okay, on PPIs CAD: asx, control CV RF High cholesterol: On Pravachol, last LDL 67. RTC 6 to 8 months     This visit occurred during the SARS-CoV-2 public health emergency.  Safety protocols  were in place, including screening questions prior to the visit, additional usage of staff PPE, and extensive cleaning of exam room while observing appropriate contact time as indicated for disinfecting solutions.

## 2020-03-21 IMAGING — CT CT HEAR MORPH WITH CTA COR WITH SCORE WITH CA WITH CONTRAST AND
4 of 7 series · 8 of 20 positions shown, 9 images · IV contrast (APPLIED)
Comparison: None.
COMPARISON: None.

Addendum:
EXAM:
OVER-READ INTERPRETATION  CT CHEST

The following report is an over-read performed by radiologist Dr.
Qomandan Tiger [REDACTED] on 06/05/2018. This over-read
does not include interpretation of cardiac or coronary anatomy or
pathology. The coronary CTA interpretation by the cardiologist is
attached.
CLINICAL DATA: Chest pain
Cardiac CTA
MEDICATIONS:
Sub lingual nitro. 4mg x 2
TECHNIQUE: The patient was scanned on a Siemens [REDACTED]ice scanner. Gantry
rotation speed was 250 msecs. Collimation was 0.6 mm. A 100 kV
prospective scan was triggered in the ascending thoracic aorta at
35-75% of the R-R interval. Average HR during the scan was 60 bpm.
The 3D data set was interpreted on a dedicated work station using
MPR, MIP and VRT modes. A total of 80cc of contrast was used.

[Series 6: best diast 71 % · axial · 0.39mm/px · z∈[+1209,+1254]mm · 2 of 341 slices shown]
[im 114/341  vessel]
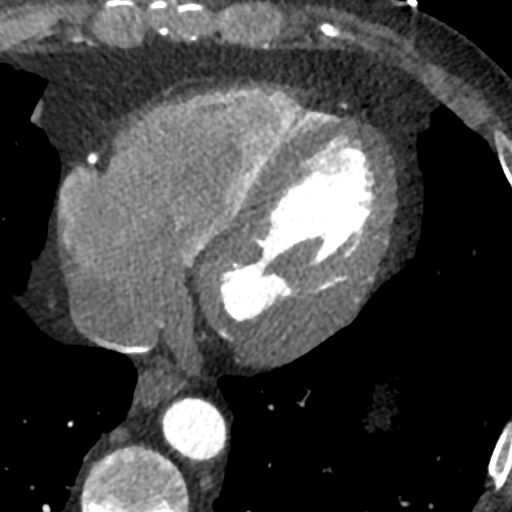
[im 227/341  vessel]
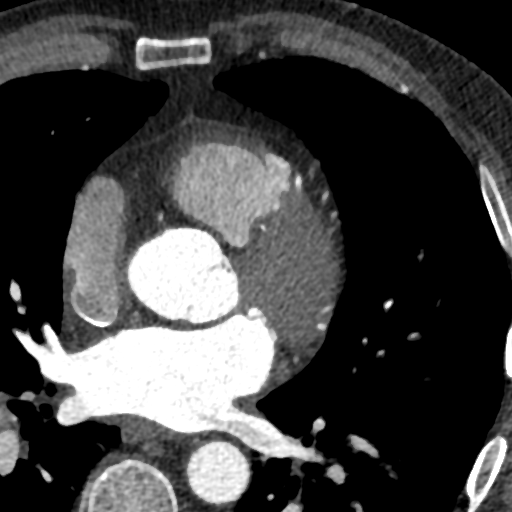

[Series 7: best syst · axial · 0.39mm/px · z∈[+1209,+1254]mm · 2 of 341 slices shown, 3 images]
[im 114/341  vessel]
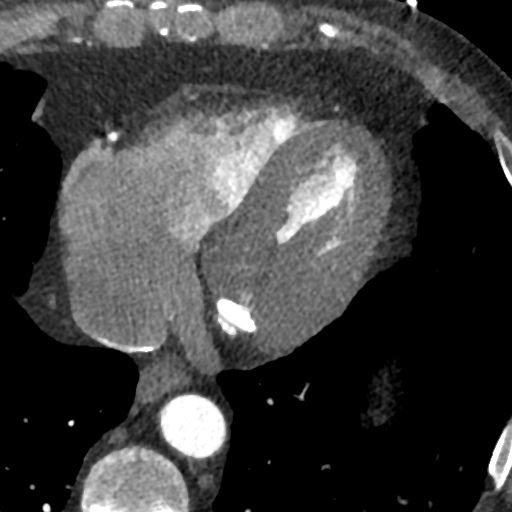
[im 114/341  lung]
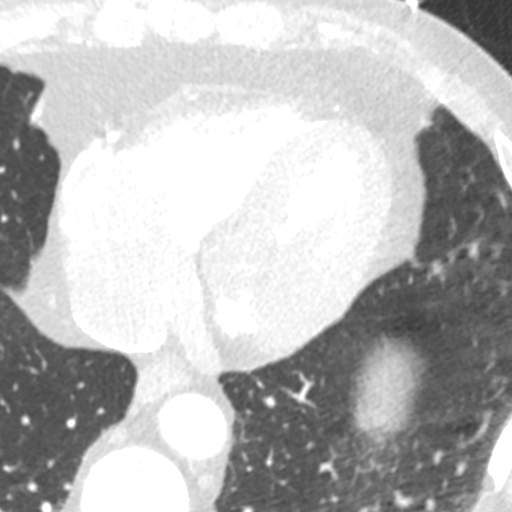
[im 227/341  vessel]
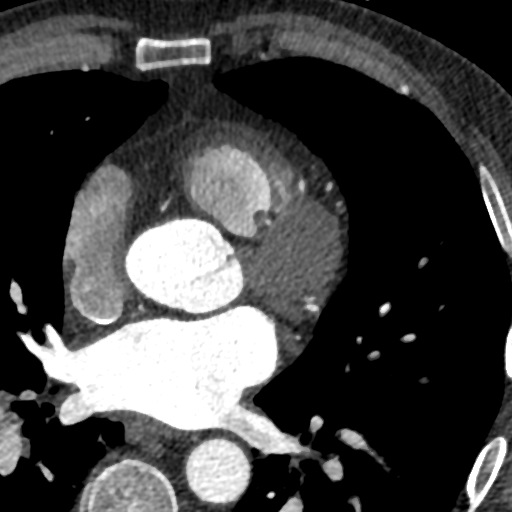

[Series 8: ts diast sharp 71 % · axial · 0.39mm/px · z∈[+1209,+1254]mm · 2 of 341 slices shown]
[im 114/341  lung]
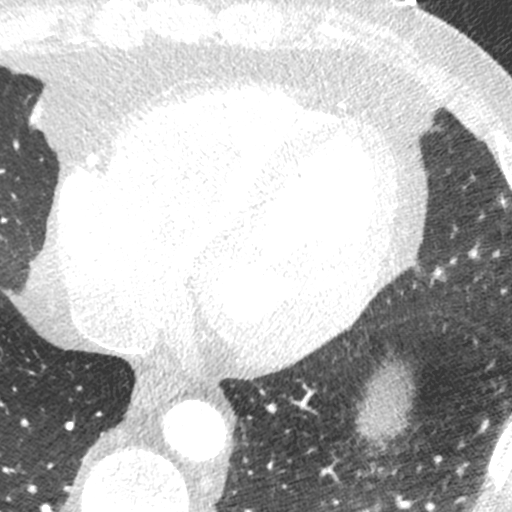
[im 227/341  lung]
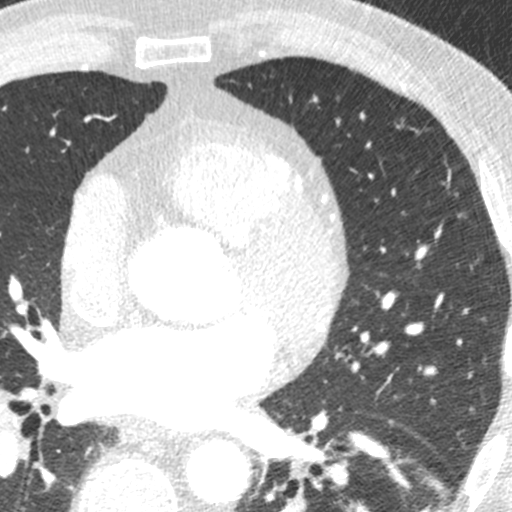

[Series 9: ts syst sharp · axial · 0.39mm/px · z∈[+1209,+1254]mm · 2 of 341 slices shown]
[im 114/341  lung]
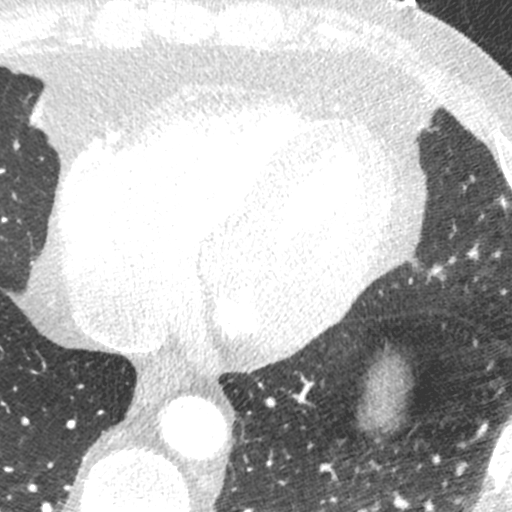
[im 227/341  lung]
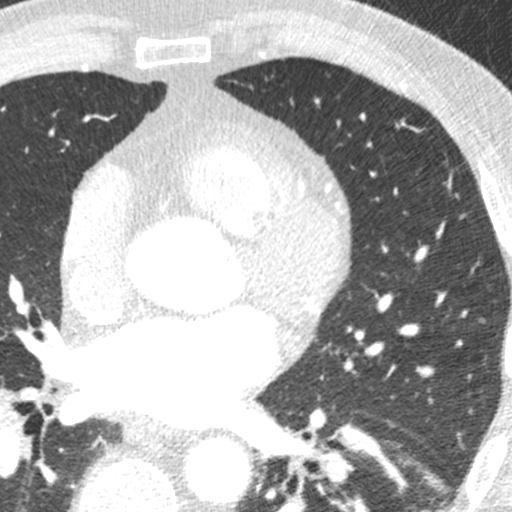

[8 of 20 positions shown; findings below may reference images not displayed]

FINDINGS: Vascular: Heart is normal size.  Visualized aorta is normal caliber.

Mediastinum/Nodes: No adenopathy in the lower mediastinum or hila.

Lungs/Pleura: Dependent atelectasis in the lower lobes. No
effusions.

Upper Abdomen: Imaging into the upper abdomen shows no acute
findings.

Musculoskeletal: Chest wall soft tissues are unremarkable. No acute
bony abnormality.
IMPRESSION: No acute or significant extracardiac abnormality.
FINDINGS: Non-cardiac: See separate report from [REDACTED].

Pulmonary veins drained normally to the left atrium.

Calcium Score: 4.6 Agatston units.

Coronary Arteries: Right dominant with no anomalies

LM: No plaque or stenosis.

LAD system: Large D1, no plaque or stenosis. Mixed plaque, mild
(<50%) stenosis in the proximal LAD at the take-off of D1.

Circumflex system: No plaque or stenosis.

RCA system: No plaque or stenosis.
IMPRESSION: 1. Coronary artery calcium score 4.6 Agatston units. This places the
patient in the 23rd percentile for age and gender, suggesting low
risk for future cardiac events.

2. Suspect nonobstructive proximal LAD stenosis. Will send for FFR
to confirm.

Ninoska Steffens

*** End of Addendum ***
EXAM:
OVER-READ INTERPRETATION  CT CHEST

The following report is an over-read performed by radiologist Dr.
Qomandan Tiger [REDACTED] on 06/05/2018. This over-read
does not include interpretation of cardiac or coronary anatomy or
pathology. The coronary CTA interpretation by the cardiologist is
attached.
FINDINGS: Vascular: Heart is normal size.  Visualized aorta is normal caliber.

Mediastinum/Nodes: No adenopathy in the lower mediastinum or hila.

Lungs/Pleura: Dependent atelectasis in the lower lobes. No
effusions.

Upper Abdomen: Imaging into the upper abdomen shows no acute
findings.

Musculoskeletal: Chest wall soft tissues are unremarkable. No acute
bony abnormality.
IMPRESSION: No acute or significant extracardiac abnormality.

## 2020-04-26 DIAGNOSIS — Z85828 Personal history of other malignant neoplasm of skin: Secondary | ICD-10-CM | POA: Diagnosis not present

## 2020-04-26 DIAGNOSIS — D225 Melanocytic nevi of trunk: Secondary | ICD-10-CM | POA: Diagnosis not present

## 2020-04-26 DIAGNOSIS — L814 Other melanin hyperpigmentation: Secondary | ICD-10-CM | POA: Diagnosis not present

## 2020-04-26 DIAGNOSIS — D2261 Melanocytic nevi of right upper limb, including shoulder: Secondary | ICD-10-CM | POA: Diagnosis not present

## 2020-04-29 ENCOUNTER — Other Ambulatory Visit: Payer: Self-pay | Admitting: *Deleted

## 2020-04-29 MED ORDER — PRAVASTATIN SODIUM 20 MG PO TABS: 20.0000 mg | ORAL_TABLET | Freq: Every day | ORAL | 1 refills | Status: DC

## 2020-04-29 NOTE — Telephone Encounter (Signed)
Rx refill sent to pharmacy. Made appt for pt to see Dr. Bettina Gavia on 08/02/20, sent in refill of Pravastatin.

## 2020-05-03 ENCOUNTER — Other Ambulatory Visit: Payer: Self-pay

## 2020-05-03 ENCOUNTER — Ambulatory Visit (INDEPENDENT_AMBULATORY_CARE_PROVIDER_SITE_OTHER): Payer: BC Managed Care – PPO

## 2020-05-03 ENCOUNTER — Ambulatory Visit: Payer: BC Managed Care – PPO

## 2020-05-03 DIAGNOSIS — Z23 Encounter for immunization: Secondary | ICD-10-CM | POA: Diagnosis not present

## 2020-05-03 NOTE — Progress Notes (Signed)
Patient here today for second shingrix. 0.5mL given in left deltoid IM. Patient tolerated well. VIS given. [facility] updated.  

## 2020-06-03 ENCOUNTER — Ambulatory Visit: Payer: BC Managed Care – PPO | Admitting: Cardiology

## 2020-07-20 DIAGNOSIS — K219 Gastro-esophageal reflux disease without esophagitis: Secondary | ICD-10-CM | POA: Insufficient documentation

## 2020-07-20 DIAGNOSIS — R011 Cardiac murmur, unspecified: Secondary | ICD-10-CM | POA: Insufficient documentation

## 2020-07-20 DIAGNOSIS — N400 Enlarged prostate without lower urinary tract symptoms: Secondary | ICD-10-CM | POA: Insufficient documentation

## 2020-08-01 NOTE — Progress Notes (Signed)
Cardiology Office Note:    Date:  08/02/2020   ID:  Jacob Ray, DOB 03-Mar-1950, MRN [id number]  PCP:  Colon Branch, MD  Cardiologist:  Shirlee More, MD    Referring MD: Colon Branch, MD    ASSESSMENT:    1. Coronary artery disease involving native coronary artery of native heart without angina pectoris   2. Dyslipidemia    PLAN:    In order of problems listed above:  He continues to do well with mild CAD asymptomatic having no angina he will continue aspirin and statin he has nitroglycerin if needed I told him if he develops symptoms to contact me for reassessment.  At this time I do not think he needs functional testing stress test and I would not repeat his coronary CTA at this time. Stable Ideal lipids plan for repeat profile in November   Next appointment: 1 year   Medication Adjustments/Labs and Tests Ordered: Current medicines are reviewed at length with the patient today.  Concerns regarding medicines are outlined above.  Orders Placed This Encounter  Procedures   Comprehensive metabolic panel   Lipid panel   EKG 12-Lead   No orders of the defined types were placed in this encounter.   Chief Complaint  Patient presents with   Follow-up   Coronary Artery Disease    History of Present Illness:    Jacob Ray is a 70 y.o. male with a hx of CAD left heart cath and coronary angiography July 2020 showed 40% proximal LAD stenosis mid to distal LAD 30% stenosis normal ejection fraction 55 to 60% with mild nonobstructive CAD.  He was last seen 07/25/2018.  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 He has no exercise intolerance edema shortness of breath chest pain palpitation or syncope. His last lipid profile was November and I put an order in for 1 and will do my office if he does not have a follow-up visit with his PCP He tolerates his statin without  muscle pain or weakness blood pressure is at target and has not needed to use nitroglycerin Past Medical History:  Diagnosis Date   Barrett's esophagus    EGD on 05/04/2013, next due in 5/18, [place] (basal cell carcinoma), arm 03/2013   Right forearm   Blepharitis, right eye 2013   BPH (benign prostatic hyperplasia)    GERD (gastroesophageal reflux disease)    Heart murmur    History of Helicobacter pylori infection 05/2013   Mitral regurgitation 2009   mild per ECHO   Shingles 2013   Varicose veins    bilateral    Past Surgical History:  Procedure Laterality Date   COLONOSCOPY     LEFT HEART CATH AND CORONARY ANGIOGRAPHY N/A 07/03/2018   Procedure: LEFT HEART CATH AND CORONARY ANGIOGRAPHY;  Surgeon: [name], [name], MD;  Location: [place] [facility];  Service: Cardiovascular;  Laterality: N/A;   SPIROMETRY  03/18/2013   [facility]   TONSILLECTOMY     UPPER GASTROINTESTINAL ENDOSCOPY     WISDOM TOOTH EXTRACTION      Current Medications: Current Meds  Medication Sig   aspirin EC 81 MG tablet Take 81 mg by mouth daily.    nitroGLYCERIN (NITROSTAT) 0.4 MG SL tablet Place 1 tablet (0.4 mg total) under the tongue every 5 (five) minutes x 3 doses as needed for chest pain.  pantoprazole (PROTONIX) 20 MG tablet Take 1 tablet (20 mg total) by mouth daily.   [DISCONTINUED] pravastatin (PRAVACHOL) 20 MG tablet Take 1 tablet (20 mg total) by mouth daily.     Allergies:   Patient has no known allergies.   Social History   Socioeconomic History   Marital status: Married    Spouse name: Not on file   Number of children: 1   Years of education: Not on file   Highest education level: Not on file  Occupational History   Occupation: Press photographer , works full time   Tobacco Use   Smoking status: Never   Smokeless tobacco: Current    Types: Chew   Tobacco comments:    rarrely   Vaping Use   Vaping Use: Never used  Substance and Sexual Activity   Alcohol use: Yes     Alcohol/week: 3.0 - 5.0 standard drinks    Types: 3 - 5 Cans of beer per week    Comment: 2 times week    Drug use: No   Sexual activity: Not on file  Other Topics Concern   Not on file  Social History Narrative   Household-- pt and wife   Adult child    Social Determinants of Health   Financial Resource Strain: Not on file  Food Insecurity: Not on file  Transportation Needs: Not on file  Physical Activity: Not on file  Stress: Not on file  Social Connections: Not on file     Family History: The patient's family history includes CAD in his brother; CAD (age of onset: 22) in his father; Diabetes in his brother; Hypertension in his father; Obesity in his brother; Prostate cancer (age of onset: 7) in his father; Valvular heart disease in his brother and father. There is no history of Colon cancer, Colon polyps, Esophageal cancer, Rectal cancer, or Stomach cancer. ROS:   Please see the history of present illness.    All other systems reviewed and are negative.  EKGs/Labs/Other Studies Reviewed:    The following studies were reviewed today:  EKG:  EKG ordered today and personally reviewed.  The ekg ordered today demonstrates sinus rhythm normal EKG  Recent Labs: 11/02/2019: ALT 15; Hemoglobin 15.5; Platelets 236 03/03/2020: BUN 18; Creatinine, Ser 1.10; Potassium 4.8; Sodium 139; TSH 2.88  Recent Lipid Panel    Component Value Date/Time   CHOL 138 11/02/2019 0842   TRIG 199 (H) 11/02/2019 0842   HDL 44 11/02/2019 0842   CHOLHDL 3.1 11/02/2019 0842   VLDL 21.8 09/05/2018 0736   LDLCALC 67 11/02/2019 0842   LDLDIRECT 93.0 11/15/2014 0751    Physical Exam:    VS:  BP 117/80 (BP Location: Left Arm, Patient Position: Sitting, Cuff Size: Normal)   Pulse 66   Ht '6\' 2"'$  (1.88 m)   Wt 228 lb 1.9 oz (103.5 kg)   SpO2 98%   BMI 29.29 kg/m     Wt Readings from Last 3 Encounters:  08/02/20 228 lb 1.9 oz (103.5 kg)  03/03/20 231 lb 8 oz (105 kg)  11/24/19 230 lb (104.3 kg)      GEN: Looks younger than his age well nourished, well developed in no acute distress HEENT: Normal NECK: No JVD; No carotid bruits LYMPHATICS: No lymphadenopathy CARDIAC: RRR, no murmurs, rubs, gallops RESPIRATORY:  Clear to auscultation without rales, wheezing or rhonchi  ABDOMEN: Soft, non-tender, non-distended MUSCULOSKELETAL:  No edema; No deformity  SKIN: Warm and dry NEUROLOGIC:  Alert and oriented x 3  PSYCHIATRIC:  Normal affect    Signed, Shirlee More, MD  08/02/2020 9:53 AM    [facility]

## 2020-08-02 ENCOUNTER — Other Ambulatory Visit: Payer: Self-pay

## 2020-08-02 ENCOUNTER — Ambulatory Visit (INDEPENDENT_AMBULATORY_CARE_PROVIDER_SITE_OTHER): Payer: BC Managed Care – PPO | Admitting: Cardiology

## 2020-08-02 ENCOUNTER — Encounter: Payer: Self-pay | Admitting: Cardiology

## 2020-08-02 VITALS — BP 117/80 | HR 66 | Ht 74.0 in | Wt 228.1 lb

## 2020-08-02 DIAGNOSIS — E785 Hyperlipidemia, unspecified: Secondary | ICD-10-CM | POA: Diagnosis not present

## 2020-08-02 DIAGNOSIS — I2510 Atherosclerotic heart disease of native coronary artery without angina pectoris: Secondary | ICD-10-CM | POA: Diagnosis not present

## 2020-08-02 MED ORDER — PRAVASTATIN SODIUM 20 MG PO TABS: 20.0000 mg | ORAL_TABLET | Freq: Every day | ORAL | 2 refills | Status: DC

## 2020-08-02 NOTE — Patient Instructions (Signed)
Medication Instructions:  Your physician recommends that you continue on your current medications as directed. Please refer to the Current Medication list given to you today.  *If you need a refill on your cardiac medications before your next appointment, please call your pharmacy*   Lab Work: Your physician recommends that you return for lab work in: November for a complete metabolic panel and Fasting lipid panel   If you have labs (blood work) drawn today and your tests are completely normal, you will receive your results only by: [employer] (if you have MyChart) OR A paper copy in the mail If you have any lab test that is abnormal or we need to change your treatment, we will call you to review the results.   Testing/Procedures: EKG   Follow-Up: At [facility], you and your health needs are our priority.  As part of our continuing mission to provide you with exceptional heart care, we have created designated Provider Care Teams.  These Care Teams include your primary Cardiologist (physician) and Advanced Practice Providers (APPs -  Physician Assistants and Nurse Practitioners) who all work together to provide you with the care you need, when you need it.  We recommend signing up for the patient portal called "MyChart".  Sign up information is provided on this After Visit Summary.  MyChart is used to connect with patients for Virtual Visits (Telemedicine).  Patients are able to view lab/test results, encounter notes, upcoming appointments, etc.  Non-urgent messages can be sent to your provider as well.   To learn more about what you can do with MyChart, go to [email or web address].    Your next appointment:   1 year(s)  The format for your next appointment:   In Person  Provider:   Shirlee More, MD   Other Instructions

## 2020-09-06 ENCOUNTER — Encounter: Payer: Self-pay | Admitting: Internal Medicine

## 2020-09-06 ENCOUNTER — Ambulatory Visit: Payer: BC Managed Care – PPO | Admitting: Internal Medicine

## 2020-10-27 ENCOUNTER — Telehealth: Payer: Self-pay | Admitting: Internal Medicine

## 2020-10-27 NOTE — Telephone Encounter (Signed)
Spoke w/ Jacob Ray- informed that we do not have record that Pt has had his flu vaccine yet this year.

## 2020-10-27 NOTE — Telephone Encounter (Signed)
Pt. Wife called and wanted to see if husband has had the flu shot.

## 2021-01-16 ENCOUNTER — Other Ambulatory Visit: Payer: Self-pay

## 2021-01-16 MED ORDER — PANTOPRAZOLE SODIUM 20 MG PO TBEC: 20.0000 mg | DELAYED_RELEASE_TABLET | Freq: Every day | ORAL | 0 refills | Status: DC

## 2021-01-16 NOTE — Progress Notes (Signed)
Refill request rec'd via fax for pantoprazole 20 mg qd. Refilled for 90 days. Patient needs an OV for further refills

## 2021-03-17 ENCOUNTER — Encounter: Payer: BC Managed Care – PPO | Admitting: Internal Medicine

## 2021-03-29 ENCOUNTER — Ambulatory Visit (INDEPENDENT_AMBULATORY_CARE_PROVIDER_SITE_OTHER): Payer: BC Managed Care – PPO | Admitting: Internal Medicine

## 2021-03-29 ENCOUNTER — Ambulatory Visit: Payer: BC Managed Care – PPO | Attending: Internal Medicine

## 2021-03-29 ENCOUNTER — Encounter: Payer: Self-pay | Admitting: Internal Medicine

## 2021-03-29 VITALS — BP 124/68 | HR 65 | Temp 98.0°F | Resp 16 | Ht 74.0 in | Wt 224.5 lb

## 2021-03-29 DIAGNOSIS — E291 Testicular hypofunction: Secondary | ICD-10-CM | POA: Diagnosis not present

## 2021-03-29 DIAGNOSIS — E785 Hyperlipidemia, unspecified: Secondary | ICD-10-CM | POA: Diagnosis not present

## 2021-03-29 DIAGNOSIS — E559 Vitamin D deficiency, unspecified: Secondary | ICD-10-CM | POA: Diagnosis not present

## 2021-03-29 DIAGNOSIS — R7989 Other specified abnormal findings of blood chemistry: Secondary | ICD-10-CM | POA: Diagnosis not present

## 2021-03-29 DIAGNOSIS — Z0001 Encounter for general adult medical examination with abnormal findings: Secondary | ICD-10-CM

## 2021-03-29 DIAGNOSIS — Z0000 Encounter for general adult medical examination without abnormal findings: Secondary | ICD-10-CM | POA: Diagnosis not present

## 2021-03-29 DIAGNOSIS — Z23 Encounter for immunization: Secondary | ICD-10-CM

## 2021-03-29 LAB — COMPREHENSIVE METABOLIC PANEL
ALT: 11 U/L (ref 0–53)
AST: 12 U/L (ref 0–37)
Albumin: 4.2 g/dL (ref 3.5–5.2)
Alkaline Phosphatase: 46 U/L (ref 39–117)
BUN: 18 mg/dL (ref 6–23)
CO2: 28 mEq/L (ref 19–32)
Calcium: 9.2 mg/dL (ref 8.4–10.5)
Chloride: 104 mEq/L (ref 96–112)
Creatinine, Ser: 1.1 mg/dL (ref 0.40–1.50)
GFR: 68.71 mL/min (ref >60.00)
Glucose, Bld: 93 mg/dL (ref 70–99)
Potassium: 4.6 mEq/L (ref 3.5–5.1)
Sodium: 137 mEq/L (ref 135–145)
Total Bilirubin: 1.2 mg/dL (ref 0.2–1.2)
Total Protein: 6.5 g/dL (ref 6.0–8.3)

## 2021-03-29 LAB — LIPID PANEL
Cholesterol: 128 mg/dL (ref 0–200)
HDL: 47.9 mg/dL (ref >39.00)
LDL Cholesterol: 59 mg/dL (ref 0–99)
NonHDL: 80.15
Total CHOL/HDL Ratio: 3
Triglycerides: 108 mg/dL (ref 0.0–149.0)
VLDL: 21.6 mg/dL (ref 0.0–40.0)

## 2021-03-29 LAB — CBC WITH DIFFERENTIAL/PLATELET
Basophils Absolute: 0 10*3/uL (ref 0.0–0.1)
Basophils Relative: 0.5 % (ref 0.0–3.0)
Eosinophils Absolute: 0.1 10*3/uL (ref 0.0–0.7)
Eosinophils Relative: 2.8 % (ref 0.0–5.0)
HCT: 43.6 % (ref 39.0–52.0)
Hemoglobin: 14.8 g/dL (ref 13.0–17.0)
Lymphocytes Relative: 28.1 % (ref 12.0–46.0)
Lymphs Abs: 1.1 10*3/uL (ref 0.7–4.0)
MCHC: 33.9 g/dL (ref 30.0–36.0)
MCV: 93.1 fl (ref 78.0–100.0)
Monocytes Absolute: 0.4 10*3/uL (ref 0.1–1.0)
Monocytes Relative: 8.9 % (ref 3.0–12.0)
Neutro Abs: 2.4 10*3/uL (ref 1.4–7.7)
Neutrophils Relative %: 59.7 % (ref 43.0–77.0)
Platelets: 221 10*3/uL (ref 150.0–400.0)
RBC: 4.68 Mil/uL (ref 4.22–5.81)
RDW: 13.1 % (ref 11.5–15.5)
WBC: 4.1 10*3/uL (ref 4.0–10.5)

## 2021-03-29 LAB — TESTOSTERONE: Testosterone: 373.6 ng/dL (ref 300.00–890.00)

## 2021-03-29 LAB — VITAMIN D 25 HYDROXY (VIT D DEFICIENCY, FRACTURES): VITD: 22.61 ng/mL — ABNORMAL LOW (ref 30.00–100.00)

## 2021-03-29 LAB — TSH: TSH: 4.34 u[IU]/mL (ref 0.35–5.50)

## 2021-03-29 NOTE — Progress Notes (Signed)
? ?  Covid-19 Vaccination Clinic ? ?Name:  Jacob Ray    ?MRN: [id number] ?DOB: [birth year] ? ?03/29/2021 ? ?[name] was observed post Covid-19 immunization for 15 minutes without incident. He was provided with Vaccine Information Sheet and instruction to access the V-Safe system.  ? ?[name] was instructed to call 911 with any severe reactions post vaccine: ?Difficulty breathing  ?Swelling of face and throat  ?A fast heartbeat  ?A bad rash all over body  ?Dizziness and weakness  ? ?Immunizations Administered   ? ? Name Date Dose VIS Date Route  ? Ambulance person Booster 03/29/2021  9:57 AM 0.3 mL 08/31/2020 Intramuscular  ? Manufacturer: [place]: [phone]  ? [place]: [phone]  ? ?  ? ? ?

## 2021-03-29 NOTE — Patient Instructions (Addendum)
Take vitamin D over-the-counter, 2000 units daily ? ?GO TO THE LAB : Get the blood work   ? ? ?[place], [employer] ?Come back for a checkup in 6 months ? ? ?"Living will", "Health Care Power of attorney": Advanced care planning ? ?(If you already have a living will or healthcare power of attorney, please bring the copy to be scanned in your chart.) ? ?Advance care planning is a process that supports adults in  understanding and sharing their preferences regarding future medical care.  ? ?The patient's preferences are recorded in documents called Advance Directives.    ?Advanced directives are completed (and can be modified at any time) while the patient is in full mental capacity.  ? ?The documentation should be available at all times to the patient, the family and the healthcare providers.  ?Bring in a copy to be scanned in your chart is an excellent idea and is recommended  ? ?This legal documents direct treatment decision making and/or appoint a surrogate to make the decision if the patient is not capable to do so.  ? ? ?Advance directives can be documented in many types of formats,  documents have names such as:  ?Lliving will  ?Durable power of attorney for healthcare (healthcare proxy or healthcare power of attorney)  ?Combined directives  ?Physician orders for life-sustaining treatment  ?  ?More information at: ? ?[email or web address] ?

## 2021-03-29 NOTE — Progress Notes (Signed)
? ?Subjective:  ? ? Patient ID: Jacob Ray, male    DOB: 1950/05/29, 71 y.o.   MRN: [id number] ? ?DOS:  03/29/2021 ?Type of visit - description: cpx ? ?Here for CPX. ?Has multiple questions, see assessment and plan. ?One of his main concern is loss of muscle mass, mild weight loss noted. ?He remains physically active at work and at home without any problems. ?Denies fatigue, chest pain. ? ? ?Wt Readings from Last 3 Encounters:  ?03/29/21 224 lb 8 oz (101.8 kg)  ?08/02/20 228 lb 1.9 oz (103.5 kg)  ?03/03/20 231 lb 8 oz (105 kg)  ? ? ?Review of Systems ? ?Other than above, a 14 point review of systems is negative  ? ?  ? ? ?Past Medical History:  ?Diagnosis Date  ? Barrett's esophagus   ? EGD on 05/04/2013, next due in 5/18, [facility]  ? BCC (basal cell carcinoma), arm 03/2013  ? Right forearm  ? Blepharitis, right eye 2013  ? BPH (benign prostatic hyperplasia)   ? GERD (gastroesophageal reflux disease)   ? Heart murmur   ? History of Helicobacter pylori infection 05/2013  ? Mitral regurgitation 2009  ? mild per ECHO  ? Shingles 2013  ? Varicose veins   ? bilateral  ? ? ?Past Surgical History:  ?Procedure Laterality Date  ? COLONOSCOPY    ? LEFT HEART CATH AND CORONARY ANGIOGRAPHY N/A 07/03/2018  ? Procedure: LEFT HEART CATH AND CORONARY ANGIOGRAPHY;  Surgeon: [name], [name], MD;  Location: [facility];  Service: Cardiovascular;  Laterality: N/A;  ? SPIROMETRY  03/18/2013  ? [facility]  ? TONSILLECTOMY    ? UPPER GASTROINTESTINAL ENDOSCOPY    ? WISDOM TOOTH EXTRACTION    ? ?Social History  ? ?Socioeconomic History  ? Marital status: Married  ?  Spouse name: Not on file  ? Number of children: 1  ? Years of education: Not on file  ? Highest education level: Not on file  ?Occupational History  ? Occupation: Press photographer , works full time   ?Tobacco Use  ? Smoking status: Never  ? Smokeless tobacco: Current  ?  Types: Chew  ? Tobacco comments:  ?  Rarely, sees dentist regularly, not ready to quit   ?Vaping  Use  ? Vaping Use: Never used  ?Substance and Sexual Activity  ? Alcohol use: Yes  ?  Alcohol/week: 3.0 - 5.0 standard drinks  ?  Types: 3 - 5 Cans of beer per week  ?  Comment: 2 times week   ? Drug use: No  ? Sexual activity: Not on file  ?Other Topics Concern  ? Not on file  ?Social History Narrative  ? Household-- pt and wife  ? Adult child   ? ?Social Determinants of Health  ? ?Financial Resource Strain: Not on file  ?Food Insecurity: Not on file  ?Transportation Needs: Not on file  ?Physical Activity: Not on file  ?Stress: Not on file  ?Social Connections: Not on file  ?Intimate Partner Violence: Not on file  ? ? ?Current Outpatient Medications  ?Medication Instructions  ? aspirin EC 81 mg, Oral, Daily  ? Cholecalciferol (VITAMIN D) 50 MCG (2000 UT) CAPS Oral  ? COVID-19 mRNA bivalent vaccine, Pfizer, (PFIZER COVID-19 VAC BIVALENT) injection Intramuscular  ? nitroGLYCERIN (NITROSTAT) 0.4 mg, Sublingual, Every 5 min x3 PRN  ? pantoprazole (PROTONIX) 20 mg, Oral, Daily  ? pravastatin (PRAVACHOL) 20 mg, Oral, Daily  ? ? ?   ?Objective:  ?  Physical Exam ?BP 124/68 (BP Location: Left Arm, Patient Position: Sitting, Cuff Size: Normal)   Pulse 65   Temp 98 ?F (36.7 ?C) (Oral)   Resp 16   Ht '6\' 2"'$  (1.88 m)   Wt 224 lb 8 oz (101.8 kg)   SpO2 97%   BMI 28.82 kg/m?  ?General: ?Well developed, NAD, BMI noted ?Neck: No  thyromegaly  ?HEENT:  ?Normocephalic . Face symmetric, atraumatic ?Lungs:  ?CTA B ?Normal respiratory effort, no intercostal retractions, no accessory muscle use. ?Heart: RRR,  no murmur.  ?Abdomen:  ?Not distended, soft, non-tender. No rebound or rigidity.   ?Lower extremities: no pretibial edema bilaterally ?DRE: Normal prostate, brown stools. ?Skin: Exposed areas without rash. Not pale. Not jaundice ?Neurologic:  ?alert & oriented X3.  ?Speech normal, gait appropriate for age and unassisted ?Strength symmetric and appropriate for age.  ?Psych: ?Cognition and judgment appear intact.  ?Cooperative  with normal attention span and concentration.  ?Behavior appropriate. ?No anxious or depressed appearing. ? ?   ?Assessment   ? ?  ? Assessment  ?GERD --   [name] , s/p EGD x 2, no major findings per pt  ?Esophageal  stricture, dilatation 07/12/2010 ? Barrett's esophagus, last EGD 05/04/2013: Pathology: Barrett's esophagitis, eosinophilic esophagitis, eosinophilic gastritis, gastric polyp, H. pylori infection. ?H. pylori testing 11-2015: Negative ?EGD 2018: + Bx for Barrett's ?EGD: 11-2019, next per GI ?CAD: had CP, cath 07/2018: Mild single-vessel disease.  Medical therapy ?H/o  BPH, has seen urology. ?Skin cancer: BCC per biopsy 03/24/2013  ?Vitamin D deficiency ?Shingles 2013 ?H/o kidney stone ~ 09-2015 per pt report ?Varicose veins, right ankle ? ?PLAN: ?Here for CPX ?GERD, Barrett's esophagus: Asymptomatic.  Denies dysphagia, odynophagia. ?CAD, mild single-vessel disease, saw cardiology 08-2020, asx, no further testing recommended ?Skin cancer: Sees dermatology regularly ?Vitamin D deficiency: Checking labs.  Not on supplements at this point, rec vitamin D 2000 units daily ?Muscle waist: Reports a noticeable decrease in muscle mass, no unusual aches or pains.  Will rule out hypogonadism, check total testosterone. ?Mild weight loss noted.  Checking labs. ?Patient wonders about a chest x-ray, bone density test: No indications for such. ?Will share results w/ cards ?RTC 6 months ? ?In addition I addressed his chronic medical problems and a new 1 (muscle waist) ? ?This visit occurred during the SARS-CoV-2 public health emergency.  Safety protocols were in place, including screening questions prior to the visit, additional usage of staff PPE, and extensive cleaning of exam room while observing appropriate contact time as indicated for disinfecting solutions.  ? ?

## 2021-03-30 ENCOUNTER — Encounter: Payer: Self-pay | Admitting: Internal Medicine

## 2021-03-30 ENCOUNTER — Other Ambulatory Visit (HOSPITAL_BASED_OUTPATIENT_CLINIC_OR_DEPARTMENT_OTHER): Payer: Self-pay

## 2021-03-30 MED ORDER — PFIZER COVID-19 VAC BIVALENT 30 MCG/0.3ML IM SUSP
INTRAMUSCULAR | 0 refills | Status: DC
  Filled 2021-03-30: qty 0.3, 1d supply, fill #0

## 2021-03-30 NOTE — Assessment & Plan Note (Signed)
Here for CPX ?GERD, Barrett's esophagus: Asymptomatic.  Denies dysphagia, odynophagia. ?CAD, mild single-vessel disease, saw cardiology 08-2020, asx, no further testing recommended ?Skin cancer: Sees dermatology regularly ?Vitamin D deficiency: Checking labs.  Not on supplements at this point, rec vitamin D 2000 units daily ?Muscle waist: Reports a noticeable decrease in muscle mass, no unusual aches or pains.  Will rule out hypogonadism, check total testosterone. ?Mild weight loss noted.  Checking labs. ?Patient wonders about a chest x-ray, bone density test: No indications for such. ?Will share results w/ cards ?RTC 6 months ?

## 2021-03-30 NOTE — Assessment & Plan Note (Signed)
-  Td 2016 ?- PNM 23: 2020 ?-PNM: 20 today ?- s/p Shingrix:  ?-COVID VAX: likes to proceed w/ a booster  ?-CCS: [name]: Colonoscopy   05-2010, 05/18/2013, inadequate prep, no polyp, no BX, repeat in 5 years. ?Colonoscopy 11-2016 at [facility]: No polyps, internal hemorrhoids, 10 years ?-Prostate ca screening: + FH,  DRE today and last  PSA wnl ?-Doing well with diet, exercise. ?-Still chews tobacco rarely, not inclined to stop, sees a  dentist regularly ?-Labs: CMP, TSH FLP, CBC, vitamin D, total testosterone, ?-ACP recommended ? ? ?  ?

## 2021-03-31 MED ORDER — VITAMIN D (ERGOCALCIFEROL) 1.25 MG (50000 UNIT) PO CAPS: 50000.0000 [IU] | ORAL_CAPSULE | ORAL | 0 refills | Status: DC

## 2021-03-31 NOTE — Addendum Note (Signed)
Addended byDamita Dunnings D on: 03/31/2021 02:34 PM ? ? Modules accepted: Orders ? ?

## 2021-05-04 DIAGNOSIS — Z85828 Personal history of other malignant neoplasm of skin: Secondary | ICD-10-CM | POA: Diagnosis not present

## 2021-05-04 DIAGNOSIS — D225 Melanocytic nevi of trunk: Secondary | ICD-10-CM | POA: Diagnosis not present

## 2021-05-04 DIAGNOSIS — L814 Other melanin hyperpigmentation: Secondary | ICD-10-CM | POA: Diagnosis not present

## 2021-05-04 DIAGNOSIS — L905 Scar conditions and fibrosis of skin: Secondary | ICD-10-CM | POA: Diagnosis not present

## 2021-05-19 ENCOUNTER — Telehealth: Payer: Self-pay | Admitting: Gastroenterology

## 2021-05-19 MED ORDER — PANTOPRAZOLE SODIUM 20 MG PO TBEC
20.0000 mg | DELAYED_RELEASE_TABLET | Freq: Every day | ORAL | 0 refills | Status: DC
Start: 2021-05-19 — End: 2021-06-09

## 2021-05-19 NOTE — Telephone Encounter (Signed)
Inbound call from patient stating he needed to make an appointment for a medication refill for Protonix. Patient was scheduled for 6/9 with Vicie Mutters. Patient is requesting a refill be sent  over to last him until his appointment due to already being out. Please advise.

## 2021-05-30 DIAGNOSIS — N401 Enlarged prostate with lower urinary tract symptoms: Secondary | ICD-10-CM | POA: Diagnosis not present

## 2021-05-30 DIAGNOSIS — R351 Nocturia: Secondary | ICD-10-CM | POA: Diagnosis not present

## 2021-05-30 DIAGNOSIS — R300 Dysuria: Secondary | ICD-10-CM | POA: Diagnosis not present

## 2021-05-30 DIAGNOSIS — R350 Frequency of micturition: Secondary | ICD-10-CM | POA: Diagnosis not present

## 2021-06-07 NOTE — Progress Notes (Signed)
06/09/2021 TRI CHITTICK [id number] 03/28/1950  Referring provider: Colon Branch, MD Primary GI doctor: Dr. Luvenia Starch  ASSESSMENT AND PLAN:    Barrett's esophagus with dysplasia, short segment, with GERD EGD 11/24/2019 2 cm hiatal hernia, 40 cm from incisors grossly consistent with very short segment of Barrett's, multiple small benign-appearing gastric polyps, normal duodenal bulb Squamous and granular epithelium with acute and chronic inflammation, intestinal metaplasia present, no dysplasia  he reports symptoms are currently well controlled, and denies breakthrough reflux or night time symptoms unless he is drinking increased amount of ETOH which happens once a week Counseled on decreasing ETOH, can add on pepicd for as needed for GERd.  Lifestyle changes discussed, avoid NSAIDS Continue current medications -     pantoprazole (PROTONIX) 20 MG tablet; Take 1 tablet (20 mg total) by mouth daily. -     famotidine (PEPCID) 40 MG tablet; Take 1 tablet (40 mg total) by mouth at bedtime. Follow up 1 year  History of Present Illness:  71 y.o. male  with a past medical history of GERD, history of H. pylori, mild mitral regurgitation, and short segment Barrett's esophagus and others listed below, returns to clinic today for evaluation of GERD/Barrett's esophagus.  Patient's most recent EGD 11/24/2019 still showed very short segment of Barrett's esophagus, will need recall 3 to 5 years.   Patient on pantoprazole 20 mg once daily. Here for refill, takes pantoprazole daily, does well other than Friday's.  Admits to Friday night after stressful work will drink 4-6 times a day and eats late, some ETOH on Saturday. No NSAIDS.  He is 18, works in Armed forces technical officer, plans for 2 more years.   EGD 11/24/2019 2 cm hiatal hernia, 40 cm from incisors grossly consistent with very short segment of Barrett's, multiple small benign-appearing gastric polyps, normal duodenal bulb Squamous and granular  epithelium with acute and chronic inflammation, intestinal metaplasia present, no dysplasia  EGD 11/15/2016 - 4 cm hiatal hernia was present.. A very mild benign appearing stenosis was noted at the GEJ. The patient denied any dysphagia, no dilation performed. The Z-line was slightly irregular with a slight extension of salmon colored mucosa, but no evidence of previously described long segment of Barrett's. 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 Mild gastritis, HP negative. Colonoscopy 11/15/2016 - Fair prep initially, following lavage the exam was adequate for screening purposes, Anal papilla(e) were hypertrophied., internal hemorrhoids, yhe examination was otherwise normal. No polyps. Colonoscopy 05/18/2013 - poor prep reported, no polyps removed EGD 05/04/2013 - 6cm segment of Barrett's esophagus reported, (+) for nondysplastic Barrett's on pathology Colonoscopy 05/15/2010 - poor prep reported, no polyps removed EGD 07/12/2010 - distal esophageal stricture, Barrett's esophagus  Current Medications:    Current Outpatient Medications (Cardiovascular):    pravastatin (PRAVACHOL) 20 MG tablet, Take 1 tablet (20 mg total) by mouth daily.   nitroGLYCERIN (NITROSTAT) 0.4 MG SL tablet, Place 1 tablet (0.4 mg total) under the tongue every 5 (five) minutes x 3 doses as needed for chest pain. (Patient not taking: Reported on 03/29/2021)   Current Outpatient Medications (Analgesics):    aspirin EC 81 MG tablet, Take 81 mg by mouth daily.    Current Outpatient Medications (Other):    Cholecalciferol (VITAMIN D) 50 MCG (2000 UT) CAPS, Take by mouth.   COVID-19 mRNA bivalent vaccine, Pfizer, (PFIZER COVID-19 VAC BIVALENT) injection, Inject into the muscle.   famotidine (PEPCID) 40 MG tablet, Take 1 tablet (40 mg total) by mouth at bedtime.   Vitamin D,  Ergocalciferol, (DRISDOL) 1.25 MG (50000 UNIT) CAPS capsule, Take 1 capsule (50,000 Units total) by mouth every 7 (seven) days.   pantoprazole (PROTONIX) 20 MG tablet,  Take 1 tablet (20 mg total) by mouth daily.  Surgical History:  He  has a past surgical history that includes Tonsillectomy; Wisdom tooth extraction; spirometry (03/18/2013); LEFT HEART CATH AND CORONARY ANGIOGRAPHY (N/A, 07/03/2018); Colonoscopy; and Upper gastrointestinal endoscopy. Family History:  His family history includes CAD in his brother; CAD (age of onset: 67) in his father; Diabetes in his brother; Hypertension in his father; Obesity in his brother; Prostate cancer (age of onset: 12) in his father; Valvular heart disease in his brother and father. Social History:   reports that he has never smoked. His smokeless tobacco use includes chew. He reports current alcohol use of about 3.0 - 5.0 standard drinks of alcohol per week. He reports that he does not use drugs.  Current Medications, Allergies, Past Medical History, Past Surgical History, Family History and Social History were reviewed in [employer] record.  Physical Exam: BP 124/76   Pulse 66   Ht '6\' 2"'$  (1.88 m)   Wt 223 lb (101.2 kg)   SpO2 93%   BMI 28.63 kg/m  General:   Pleasant, well developed male in no acute distress Heart : Regular rate and rhythm; no murmurs Pulm: Clear anteriorly; no wheezing Abdomen:  Soft, Obese AB, Active bowel sounds. No tenderness . , No organomegaly appreciated. Rectal: Not evaluated Extremities:  without  edema. Neurologic:  Alert and  oriented x4;  No focal deficits.  Psych:  Cooperative. Normal mood and affect.   Vladimir Crofts, PA-C 06/09/21

## 2021-06-09 ENCOUNTER — Ambulatory Visit (INDEPENDENT_AMBULATORY_CARE_PROVIDER_SITE_OTHER): Payer: BC Managed Care – PPO | Admitting: Physician Assistant

## 2021-06-09 ENCOUNTER — Encounter: Payer: Self-pay | Admitting: Physician Assistant

## 2021-06-09 VITALS — BP 124/76 | HR 66 | Ht 74.0 in | Wt 223.0 lb

## 2021-06-09 DIAGNOSIS — K22719 Barrett's esophagus with dysplasia, unspecified: Secondary | ICD-10-CM | POA: Diagnosis not present

## 2021-06-09 DIAGNOSIS — K219 Gastro-esophageal reflux disease without esophagitis: Secondary | ICD-10-CM

## 2021-06-09 MED ORDER — PANTOPRAZOLE SODIUM 20 MG PO TBEC: 20.0000 mg | DELAYED_RELEASE_TABLET | Freq: Every day | ORAL | 3 refills | Status: DC

## 2021-06-09 MED ORDER — FAMOTIDINE 40 MG PO TABS: 40.0000 mg | ORAL_TABLET | Freq: Every day | ORAL | 1 refills | Status: DC

## 2021-06-09 NOTE — Progress Notes (Signed)
Agree with assessment and plan as outlined.  

## 2021-06-09 NOTE — Patient Instructions (Signed)
Please take your proton pump inhibitor medication, pantoprazole , 30 minutes to 1 hour before meals- this makes it more effective.  CAN ADD ON PEPCID 40 MG as NEEDED AT NIGHT TIME Avoid spicy and acidic foods Avoid fatty foods Limit your intake of coffee, tea, alcohol, and carbonated drinks Work to maintain a healthy weight Keep the head of the bed elevated at least 3 inches with blocks or a wedge pillow if you are having any nighttime symptoms Stay upright for 2 hours after eating Avoid meals and snacks three to four hours before bedtime  Gastroesophageal Reflux Disease, Adult Gastroesophageal reflux (GER) happens when acid from the stomach flows up into the tube that connects the mouth and the stomach (esophagus). Normally, food travels down the esophagus and stays in the stomach to be digested. However, when a person has GER, food and stomach acid sometimes move back up into the esophagus. If this becomes a more serious problem, the person may be diagnosed with a disease called gastroesophageal reflux disease (GERD). GERD occurs when the reflux: Happens often. Causes frequent or severe symptoms. Causes problems such as damage to the esophagus. When stomach acid comes in contact with the esophagus, the acid may cause inflammation in the esophagus. Over time, GERD may create small holes (ulcers) in the lining of the esophagus. What are the causes? This condition is caused by a problem with the muscle between the esophagus and the stomach (lower esophageal sphincter, or LES). Normally, the LES muscle closes after food passes through the esophagus to the stomach. When the LES is weakened or abnormal, it does not close properly, and that allows food and stomach acid to go back up into the esophagus. The LES can be weakened by certain dietary substances, medicines, and medical conditions, including: Tobacco use. Pregnancy. Having a hiatal hernia. Alcohol use. Certain foods and beverages, such as  coffee, chocolate, onions, and peppermint. What increases the risk? You are more likely to develop this condition if you: Have an increased body weight. Have a connective tissue disorder. Take NSAIDs, such as ibuprofen. What are the signs or symptoms? Symptoms of this condition include: Heartburn. Difficult or painful swallowing and the feeling of having a lump in the throat. A bitter taste in the mouth. Bad breath and having a large amount of saliva. Having an upset or bloated stomach and belching. Chest pain. Different conditions can cause chest pain. Make sure you see your health care provider if you experience chest pain. Shortness of breath or wheezing. Ongoing (chronic) cough or a nighttime cough. Wearing away of tooth enamel. Weight loss. How is this diagnosed? This condition may be diagnosed based on a medical history and a physical exam. To determine if you have mild or severe GERD, your health care provider may also monitor how you respond to treatment. You may also have tests, including: A test to examine your stomach and esophagus with a small camera (endoscopy). A test that measures the acidity level in your esophagus. A test that measures how much pressure is on your esophagus. A barium swallow or modified barium swallow test to show the shape, size, and functioning of your esophagus. How is this treated? Treatment for this condition may vary depending on how severe your symptoms are. Your health care provider may recommend: Changes to your diet. Medicine. Surgery. The goal of treatment is to help relieve your symptoms and to prevent complications. Follow these instructions at home: Eating and drinking  Follow a diet as recommended by  your health care provider. This may involve avoiding foods and drinks such as: Coffee and tea, with or without caffeine. Drinks that contain alcohol. Energy drinks and sports drinks. Carbonated drinks or sodas. Chocolate and  cocoa. Peppermint and mint flavorings. Garlic and onions. Horseradish. Spicy and acidic foods, including peppers, chili powder, curry powder, vinegar, hot sauces, and barbecue sauce. Citrus fruit juices and citrus fruits, such as oranges, lemons, and limes. Tomato-based foods, such as red sauce, chili, salsa, and pizza with red sauce. Fried and fatty foods, such as donuts, french fries, potato chips, and high-fat dressings. High-fat meats, such as hot dogs and fatty cuts of red and white meats, such as rib eye steak, sausage, ham, and bacon. High-fat dairy items, such as whole milk, butter, and cream cheese. Eat small, frequent meals instead of large meals. Avoid drinking large amounts of liquid with your meals. Avoid eating meals during the 2-3 hours before bedtime. Avoid lying down right after you eat. Do not exercise right after you eat. Lifestyle  Do not use any products that contain nicotine or tobacco. These products include cigarettes, chewing tobacco, and vaping devices, such as e-cigarettes. If you need help quitting, ask your health care provider. Try to reduce your stress by using methods such as yoga or meditation. If you need help reducing stress, ask your health care provider. If you are overweight, reduce your weight to an amount that is healthy for you. Ask your health care provider for guidance about a safe weight loss goal. General instructions Pay attention to any changes in your symptoms. Take over-the-counter and prescription medicines only as told by your health care provider. Do not take aspirin, ibuprofen, or other NSAIDs unless your health care provider told you to take these medicines. Wear loose-fitting clothing. Do not wear anything tight around your waist that causes pressure on your abdomen. Raise (elevate) the head of your bed about 6 inches (15 cm). You can use a wedge to do this. Avoid bending over if this makes your symptoms worse. Keep all follow-up  visits. This is important. Contact a health care provider if: You have: New symptoms. Unexplained weight loss. Difficulty swallowing or it hurts to swallow. Wheezing or a persistent cough. A hoarse voice. Your symptoms do not improve with treatment. Get help right away if: You have sudden pain in your arms, neck, jaw, teeth, or back. You suddenly feel sweaty, dizzy, or light-headed. You have chest pain or shortness of breath. You vomit and the vomit is green, yellow, or black, or it looks like blood or coffee grounds. You faint. You have stool that is red, bloody, or black. You cannot swallow, drink, or eat. These symptoms may represent a serious problem that is an emergency. Do not wait to see if the symptoms will go away. Get medical help right away. Call your local emergency services (911 in the [place] Do not drive yourself to the hospital. Summary Gastroesophageal reflux happens when acid from the stomach flows up into the esophagus. GERD is a disease in which the reflux happens often, causes frequent or severe symptoms, or causes problems such as damage to the esophagus. Treatment for this condition may vary depending on how severe your symptoms are. Your health care provider may recommend diet and lifestyle changes, medicine, or surgery. Contact a health care provider if you have new or worsening symptoms. Take over-the-counter and prescription medicines only as told by your health care provider. Do not take aspirin, ibuprofen, or other NSAIDs unless your  health care provider told you to do so. Keep all follow-up visits as told by your health care provider. This is important. This information is not intended to replace advice given to you by your health care provider. Make sure you discuss any questions you have with your health care provider. Document Revised: 06/29/2019 Document Reviewed: 06/29/2019 Elsevier Patient Education  [place].

## 2021-06-14 DIAGNOSIS — N401 Enlarged prostate with lower urinary tract symptoms: Secondary | ICD-10-CM | POA: Diagnosis not present

## 2021-06-22 ENCOUNTER — Other Ambulatory Visit: Payer: Self-pay | Admitting: Internal Medicine

## 2021-07-10 ENCOUNTER — Telehealth: Payer: Self-pay | Admitting: Internal Medicine

## 2021-07-10 NOTE — Telephone Encounter (Signed)
LMOM informing Pt of PCP recommendations. Ergocalciferol removed from med list.

## 2021-07-10 NOTE — Telephone Encounter (Signed)
Medication: Vitamin D, Ergocalciferol, (DRISDOL) 1.25 MG (50000 UNIT) CAPS capsule   Has the patient contacted their pharmacy? No.  Preferred Pharmacy: [id number] - [place], [place] - [name] AT [facility] [address]   [name], [place] [address]  Phone:  [phone]  Fax:  [phone]

## 2021-07-10 NOTE — Telephone Encounter (Signed)
No, needs over-the-counter vitamin D3: 2000 units daily until next visit.

## 2021-07-10 NOTE — Telephone Encounter (Signed)
Would you like Pt to continue Ergocalciferol?  

## 2021-08-01 ENCOUNTER — Other Ambulatory Visit: Payer: Self-pay | Admitting: Cardiology

## 2021-08-01 ENCOUNTER — Other Ambulatory Visit: Payer: Self-pay

## 2021-08-01 MED ORDER — PRAVASTATIN SODIUM 20 MG PO TABS: 20.0000 mg | ORAL_TABLET | Freq: Every day | ORAL | 0 refills | Status: DC

## 2021-09-28 DIAGNOSIS — D485 Neoplasm of uncertain behavior of skin: Secondary | ICD-10-CM | POA: Diagnosis not present

## 2021-09-29 ENCOUNTER — Ambulatory Visit: Payer: BC Managed Care – PPO | Admitting: Internal Medicine

## 2021-10-13 ENCOUNTER — Other Ambulatory Visit: Payer: Self-pay

## 2021-10-16 ENCOUNTER — Encounter: Payer: Self-pay | Admitting: Internal Medicine

## 2021-10-16 ENCOUNTER — Ambulatory Visit (INDEPENDENT_AMBULATORY_CARE_PROVIDER_SITE_OTHER): Payer: BC Managed Care – PPO | Admitting: Internal Medicine

## 2021-10-16 ENCOUNTER — Ambulatory Visit (HOSPITAL_BASED_OUTPATIENT_CLINIC_OR_DEPARTMENT_OTHER)
Admission: RE | Admit: 2021-10-16 | Discharge: 2021-10-16 | Disposition: A | Discharge status: Home / Self Care | Payer: BC Managed Care – PPO | Source: Ambulatory Visit | Attending: Internal Medicine | Admitting: Internal Medicine

## 2021-10-16 VITALS — BP 122/72 | HR 75 | Temp 97.9°F | Resp 18 | Ht 74.0 in | Wt 228.5 lb

## 2021-10-16 DIAGNOSIS — K22719 Barrett's esophagus with dysplasia, unspecified: Secondary | ICD-10-CM

## 2021-10-16 DIAGNOSIS — Z13820 Encounter for screening for osteoporosis: Secondary | ICD-10-CM | POA: Diagnosis not present

## 2021-10-16 DIAGNOSIS — Z23 Encounter for immunization: Secondary | ICD-10-CM | POA: Diagnosis not present

## 2021-10-16 DIAGNOSIS — I2510 Atherosclerotic heart disease of native coronary artery without angina pectoris: Secondary | ICD-10-CM

## 2021-10-16 DIAGNOSIS — E559 Vitamin D deficiency, unspecified: Secondary | ICD-10-CM

## 2021-10-16 NOTE — Patient Instructions (Addendum)
Take OTC Vitamin D : 2,000 units every day  Vaccines I recommend:  Covid booster RSV vaccine      GO TO THE FRONT DESK, PLEASE SCHEDULE YOUR APPOINTMENTS Come back for a physical exam by 03-2022  STOP BY THE FIRST FLOOR: Schedule a bone density test

## 2021-10-16 NOTE — Assessment & Plan Note (Signed)
GERD, Barrett's esophagus:asx, on medications as needed only. CAD: On aspirin, statins, last LDL okay.  No symptoms. Osteoporosis?  The patient continues to be concerned about the issue, his wife has severe osteoporosis and had fractures.  He has vitamin D deficiency and andropause.  I agreed to check a bone density test. Vitamin D deficiency: S/p ergocalciferol, on a unknown dose of vitamin D daily.  Recommend 2000 units daily. Weight loss: See last visit, resolved.  Feels well. Vaccine advice: Flu shot today, encouraged to proceed with a COVID booster.  RSV also discussed. RTC 5 months CPX

## 2021-10-16 NOTE — Progress Notes (Signed)
Subjective:    Patient ID: Jacob Ray, male    DOB: Aug 18, 1950, 71 y.o.   MRN: [id number]  DOS:  10/16/2021 Type of visit - description:  f/u   We talk about his chronic medical problems. Status post ergocalciferol, on vitamin D OTC, dose?Marland Kitchen  He remains concerned about osteoporosis.  Denies dysphagia or odynophagia.  Wt Readings from Last 3 Encounters:  10/16/21 228 lb 8 oz (103.6 kg)  06/09/21 223 lb (101.2 kg)  03/29/21 224 lb 8 oz (101.8 kg)    Review of Systems See above   Past Medical History:  Diagnosis Date   Barrett's esophagus    EGD on 05/04/2013, next due in 5/18, [place] (basal cell carcinoma), arm 03/2013   Right forearm   Blepharitis, right eye 2013   BPH (benign prostatic hyperplasia)    GERD (gastroesophageal reflux disease)    Heart murmur    History of Helicobacter pylori infection 05/2013   Mitral regurgitation 2009   mild per ECHO   Shingles 2013   Varicose veins    bilateral    Past Surgical History:  Procedure Laterality Date   COLONOSCOPY     LEFT HEART CATH AND CORONARY ANGIOGRAPHY N/A 07/03/2018   Procedure: LEFT HEART CATH AND CORONARY ANGIOGRAPHY;  Surgeon: [name], [name], MD;  Location: [facility];  Service: Cardiovascular;  Laterality: N/A;   SPIROMETRY  03/18/2013   [facility]   TONSILLECTOMY     UPPER GASTROINTESTINAL ENDOSCOPY     WISDOM TOOTH EXTRACTION      Current Outpatient Medications  Medication Instructions   aspirin EC 81 mg, Oral, Daily   Cholecalciferol (VITAMIN D) 50 MCG (2000 UT) CAPS Oral   famotidine (PEPCID) 40 mg, Oral, Daily at bedtime   nitroGLYCERIN (NITROSTAT) 0.4 mg, Sublingual, Every 5 min x3 PRN   pantoprazole (PROTONIX) 20 mg, Oral, Daily   pravastatin (PRAVACHOL) 20 mg, Oral, Daily, Patient needs an appointment for further refills. 3 RD/FINAL  attempt       Objective:   Physical Exam BP 122/72   Pulse 75   Temp 97.9 F (36.6 C) (Oral)   Resp 18   Ht '6\' 2"'$   (1.88 m)   Wt 228 lb 8 oz (103.6 kg)   SpO2 98%   BMI 29.34 kg/m  General:   Well developed, NAD, BMI noted. HEENT:  Normocephalic . Face symmetric, atraumatic Lungs:  CTA B Normal respiratory effort, no intercostal retractions, no accessory muscle use. Heart: RRR,  no murmur.  Lower extremities: no pretibial edema bilaterally  Skin: Not pale. Not jaundice Neurologic:  alert & oriented X3.  Speech normal, gait appropriate for age and unassisted Psych--  Cognition and judgment appear intact.  Cooperative with normal attention span and concentration.  Behavior appropriate. No anxious or depressed appearing.      Assessment       Assessment  GERD --   [name] , s/p EGD x 2, no major findings per pt  Esophageal  stricture, dilatation 07/12/2010  Barrett's esophagus, last EGD 05/04/2013: Pathology: Barrett's esophagitis, eosinophilic esophagitis, eosinophilic gastritis, gastric polyp, H. pylori infection. H. pylori testing 11-2015: Negative EGD 2018: + Bx for Barrett's EGD: 11-2019, next per GI CAD: had CP, cath 07/2018: Mild single-vessel disease.  Medical therapy H/o  BPH, has seen urology. Skin cancer: BCC per biopsy 03/24/2013  Vitamin D deficiency Shingles 2013 H/o kidney stone ~ 09-2015 per pt report Varicose veins, right ankle  PLAN: GERD, Barrett's esophagus:asx, on medications as needed only. CAD: On aspirin, statins, last LDL okay.  No symptoms. Osteoporosis?  The patient continues to be concerned about the issue, his wife has severe osteoporosis and had fractures.  He has vitamin D deficiency and andropause.  I agreed to check a bone density test. Vitamin D deficiency: S/p ergocalciferol, on a unknown dose of vitamin D daily.  Recommend 2000 units daily. Weight loss: See last visit, resolved.  Feels well. Vaccine advice: Flu shot today, encouraged to proceed with a COVID booster.  RSV also discussed. RTC 5 months CPX

## 2021-10-27 ENCOUNTER — Other Ambulatory Visit: Payer: Self-pay | Admitting: Cardiology

## 2021-10-27 NOTE — Telephone Encounter (Signed)
Refill for pravastatin denied, patient needs appointment

## 2021-11-07 ENCOUNTER — Telehealth: Payer: Self-pay | Admitting: Cardiology

## 2021-11-07 ENCOUNTER — Other Ambulatory Visit: Payer: Self-pay | Admitting: Cardiology

## 2021-11-07 MED ORDER — PRAVASTATIN SODIUM 20 MG PO TABS: 20.0000 mg | ORAL_TABLET | Freq: Every day | ORAL | 0 refills | Status: DC

## 2021-11-07 NOTE — Telephone Encounter (Signed)
Rx refill sent to pharmacy. 

## 2021-11-07 NOTE — Telephone Encounter (Signed)
*  STAT* If patient is at the pharmacy, call can be transferred to refill team.   1. Which medications need to be refilled? (please list name of each medication and dose if known) Pravastatin  2. Which pharmacy/location (including street and city if local pharmacy) is medication to be sent to?[facility]  [name], [place]  3. Do they need a 30 day or 90 day supply? 90 days and refills

## 2021-11-27 DIAGNOSIS — M722 Plantar fascial fibromatosis: Secondary | ICD-10-CM | POA: Diagnosis not present

## 2021-12-05 ENCOUNTER — Telehealth: Payer: Self-pay | Admitting: Cardiology

## 2021-12-05 NOTE — Telephone Encounter (Signed)
*  STAT* If patient is at the pharmacy, call can be transferred to refill team.   1. Which medications need to be refilled? (please list name of each medication and dose if known) pravastatin (PRAVACHOL) 20 MG tablet   2. Which pharmacy/location (including street and city if local pharmacy) is medication to be sent to?  [id number] - [place], [place] - [name]    3. Do they need a 30 day or 90 day supply? [place]

## 2021-12-06 ENCOUNTER — Ambulatory Visit: Payer: BC Managed Care – PPO | Attending: Cardiology | Admitting: Cardiology

## 2021-12-06 ENCOUNTER — Encounter: Payer: Self-pay | Admitting: Cardiology

## 2021-12-06 VITALS — BP 110/78 | HR 69 | Ht 74.0 in | Wt 229.0 lb

## 2021-12-06 DIAGNOSIS — E785 Hyperlipidemia, unspecified: Secondary | ICD-10-CM

## 2021-12-06 DIAGNOSIS — I2510 Atherosclerotic heart disease of native coronary artery without angina pectoris: Secondary | ICD-10-CM | POA: Diagnosis not present

## 2021-12-06 DIAGNOSIS — I340 Nonrheumatic mitral (valve) insufficiency: Secondary | ICD-10-CM

## 2021-12-06 NOTE — Progress Notes (Signed)
Cardiology Office Note:    Date:  12/06/2021   ID:  Jacob Ray, DOB Apr 07, 1950, MRN [id number]  PCP:  Colon Branch, MD  Cardiologist:  Jenne Campus, MD    Referring MD: Colon Branch, MD   Chief Complaint  Patient presents with   Medication Management    History of Present Illness:    Jacob Ray is a 71 y.o. male past history significant for coronary disease.  In July 2020 he did have cardiac catheterization which showed 40% proximal LAD, mid to distal LAD 30%, ejection fraction 5055% rest was nonobstructive.  He also had history of dyslipidemia, mitral regurgitation only mild based on echocardiogram. He is in my office today for follow-up.  Overall doing very well.  Denies have any chest pain tightness squeezing pressure burning chest.  He can walk climb stairs with no difficulties.  He is working in [employer] and he is very busy walking around all the time  Past Medical History:  Diagnosis Date   Barrett's esophagus    EGD on 05/04/2013, next due in 5/18, [facility]   [place] (basal cell carcinoma), arm 03/2013   Right forearm   Blepharitis, right eye 2013   BPH (benign prostatic hyperplasia)    GERD (gastroesophageal reflux disease)    Heart murmur    History of Helicobacter pylori infection 05/2013   Mitral regurgitation 2009   mild per ECHO   Shingles 2013   Varicose veins    bilateral    Past Surgical History:  Procedure Laterality Date   COLONOSCOPY     LEFT HEART CATH AND CORONARY ANGIOGRAPHY N/A 07/03/2018   Procedure: LEFT HEART CATH AND CORONARY ANGIOGRAPHY;  Surgeon: [name], [name], MD;  Location: [facility];  Service: Cardiovascular;  Laterality: N/A;   SPIROMETRY  03/18/2013   [facility]   TONSILLECTOMY     UPPER GASTROINTESTINAL ENDOSCOPY     WISDOM TOOTH EXTRACTION      Current Medications: Current Meds  Medication Sig   aspirin EC 81 MG tablet Take 81 mg by mouth daily.    Cholecalciferol (VITAMIN D) 50 MCG  (2000 UT) CAPS Take 1 capsule by mouth daily.   famotidine (PEPCID) 40 MG tablet Take 1 tablet (40 mg total) by mouth at bedtime.   nitroGLYCERIN (NITROSTAT) 0.4 MG SL tablet Place 1 tablet (0.4 mg total) under the tongue every 5 (five) minutes x 3 doses as needed for chest pain.   pantoprazole (PROTONIX) 20 MG tablet Take 1 tablet (20 mg total) by mouth daily.   pravastatin (PRAVACHOL) 20 MG tablet Take 1 tablet (20 mg total) by mouth daily. Patient needs an appointment for further refills. 3 RD/FINAL  attempt     Allergies:   Patient has no known allergies.   Social History   Socioeconomic History   Marital status: Married    Spouse name: Not on file   Number of children: 1   Years of education: Not on file   Highest education level: Not on file  Occupational History   Occupation: Press photographer , works full time   Tobacco Use   Smoking status: Never   Smokeless tobacco: Current    Types: Chew   Tobacco comments:    Rarely, sees dentist regularly, not ready to quit   Vaping Use   Vaping Use: Never used  Substance and Sexual Activity   Alcohol use: Yes    Alcohol/week: 3.0 - 5.0 standard drinks of alcohol  Types: 3 - 5 Cans of beer per week    Comment: 2 times week    Drug use: No   Sexual activity: Not on file  Other Topics Concern   Not on file  Social History Narrative   Household-- pt and wife   Adult child    Social Determinants of Health   Financial Resource Strain: Not on file  Food Insecurity: Not on file  Transportation Needs: Not on file  Physical Activity: Not on file  Stress: Not on file  Social Connections: Not on file     Family History: The patient's family history includes CAD in his brother; CAD (age of onset: 63) in his father; Diabetes in his brother; Hypertension in his father; Obesity in his brother; Prostate cancer (age of onset: 16) in his father; Valvular heart disease in his brother and father. There is no history of Colon cancer, Colon polyps,  Esophageal cancer, Rectal cancer, or Stomach cancer. ROS:   Please see the history of present illness.    All 14 point review of systems negative except as described per history of present illness  EKGs/Labs/Other Studies Reviewed:      Recent Labs: 03/29/2021: ALT 11; BUN 18; Creatinine, Ser 1.10; Hemoglobin 14.8; Platelets 221.0; Potassium 4.6; Sodium 137; TSH 4.34  Recent Lipid Panel    Component Value Date/Time   CHOL 128 03/29/2021 0936   TRIG 108.0 03/29/2021 0936   HDL 47.90 03/29/2021 0936   CHOLHDL 3 03/29/2021 0936   VLDL 21.6 03/29/2021 0936   LDLCALC 59 03/29/2021 0936   LDLCALC 67 11/02/2019 0842   LDLDIRECT 93.0 11/15/2014 0751    Physical Exam:    VS:  BP 110/78 (BP Location: Left Arm, Patient Position: Sitting)   Pulse 69   Ht '6\' 2"'$  (1.88 m)   Wt 229 lb (103.9 kg)   SpO2 92%   BMI 29.40 kg/m     Wt Readings from Last 3 Encounters:  12/06/21 229 lb (103.9 kg)  10/16/21 228 lb 8 oz (103.6 kg)  06/09/21 223 lb (101.2 kg)     GEN:  Well nourished, well developed in no acute distress HEENT: Normal NECK: No JVD; No carotid bruits LYMPHATICS: No lymphadenopathy CARDIAC: RRR, no murmurs, no rubs, no gallops RESPIRATORY:  Clear to auscultation without rales, wheezing or rhonchi  ABDOMEN: Soft, non-tender, non-distended MUSCULOSKELETAL:  No edema; No deformity  SKIN: Warm and dry LOWER EXTREMITIES: no swelling NEUROLOGIC:  Alert and oriented x 3 PSYCHIATRIC:  Normal affect   ASSESSMENT:    1. Coronary artery disease involving native coronary artery of native heart without angina pectoris   2. Nonrheumatic mitral valve regurgitation   3. Dyslipidemia    PLAN:    In order of problems listed above:  Coronary disease nonobstructive based on cardiac cath from 2020 the key is risk factors modifications.  He is on antiplatelet therapy which I will continue.  Will continue with statin. Dyslipidemia I did review his K PN which show me his LDL of 59 HDL  47.9.  This is from March of this year what make me possible that is the fact that he takes very low intensity statin, only pravastatin 20 so I suspect he is cholesterol without statin will be relatively low but in spite of that he does have coronary artery disease.  I am more about LP(a).  I will ask him to have LP(a) checked as well as fasting lipid profile. Nonrheumatic mitral valve regurgitation, I do not hear  any murmur on the physical exam.  Will continue monitoring.   Medication Adjustments/Labs and Tests Ordered: Current medicines are reviewed at length with the patient today.  Concerns regarding medicines are outlined above.  No orders of the defined types were placed in this encounter.  Medication changes: No orders of the defined types were placed in this encounter.   Signed, Park Liter, MD, [facility] 12/06/2021 3:05 PM    [place]

## 2021-12-06 NOTE — Patient Instructions (Signed)
Medication Instructions:  Your physician recommends that you continue on your current medications as directed. Please refer to the Current Medication list given to you today.  *If you need a refill on your cardiac medications before your next appointment, please call your pharmacy*   Lab Work: Your physician recommends that you return for lab work in: when fasting You need to have labs done when you are fasting.  You can come Monday through Friday 8:00 am to 11:30 am and 1:00 to 4:30.  CLOSED FRIDAY AFTERNOONS. You do not need to make an appointment as the order has already been placed. The labs you are going to have done are AST, ALT, Lpa  and Lipids.    Testing/Procedures: None Ordered   Follow-Up: At [facility], you and your health needs are our priority.  As part of our continuing mission to provide you with exceptional heart care, we have created designated Provider Care Teams.  These Care Teams include your primary Cardiologist (physician) and Advanced Practice Providers (APPs -  Physician Assistants and Nurse Practitioners) who all work together to provide you with the care you need, when you need it.  We recommend signing up for the patient portal called "MyChart".  Sign up information is provided on this After Visit Summary.  MyChart is used to connect with patients for Virtual Visits (Telemedicine).  Patients are able to view lab/test results, encounter notes, upcoming appointments, etc.  Non-urgent messages can be sent to your provider as well.   To learn more about what you can do with MyChart, go to [email or web address].    Your next appointment:   12 month(s)  The format for your next appointment:   In Person  Provider:   Jenne Campus, MD    Other Instructions NA

## 2021-12-09 ENCOUNTER — Other Ambulatory Visit: Payer: Self-pay | Admitting: Cardiology

## 2021-12-11 NOTE — Telephone Encounter (Signed)
Rx refill sent to pharmacy. 

## 2021-12-12 DIAGNOSIS — E785 Hyperlipidemia, unspecified: Secondary | ICD-10-CM | POA: Diagnosis not present

## 2021-12-13 LAB — LIPOPROTEIN A (LPA): Lipoprotein (a): 80.9 nmol/L — ABNORMAL HIGH (ref <75.0)

## 2021-12-13 LAB — LIPID PANEL
Chol/HDL Ratio: 2.8 ratio (ref 0.0–5.0)
Cholesterol, Total: 138 mg/dL (ref 100–199)
HDL: 50 mg/dL (ref >39)
LDL Chol Calc (NIH): 64 mg/dL (ref 0–99)
Triglycerides: 135 mg/dL (ref 0–149)
VLDL Cholesterol Cal: 24 mg/dL (ref 5–40)

## 2021-12-13 LAB — AST: AST: 15 IU/L (ref 0–40)

## 2021-12-13 LAB — ALT: ALT: 15 IU/L (ref 0–44)

## 2021-12-14 ENCOUNTER — Telehealth: Payer: Self-pay

## 2021-12-14 MED ORDER — PRAVASTATIN SODIUM 20 MG PO TABS: 20.0000 mg | ORAL_TABLET | Freq: Every day | ORAL | 3 refills | Status: DC

## 2021-12-14 NOTE — Telephone Encounter (Signed)
Patient notified of results, I also sent a refill on Pravastatin as requested.

## 2021-12-14 NOTE — Telephone Encounter (Signed)
-----   Message from Park Liter, MD sent at 12/14/2021  4:01 PM EST ----- LP(a) minimally elevated.  We do not need to do any changes in the management continue same

## 2022-02-06 ENCOUNTER — Encounter: Payer: Self-pay | Admitting: Neurology

## 2022-02-06 ENCOUNTER — Ambulatory Visit (INDEPENDENT_AMBULATORY_CARE_PROVIDER_SITE_OTHER): Payer: BC Managed Care – PPO | Admitting: Neurology

## 2022-02-06 VITALS — BP 125/85 | HR 57 | Ht 74.0 in | Wt 231.4 lb

## 2022-02-06 DIAGNOSIS — R519 Headache, unspecified: Secondary | ICD-10-CM

## 2022-02-06 DIAGNOSIS — R200 Anesthesia of skin: Secondary | ICD-10-CM

## 2022-02-06 DIAGNOSIS — G500 Trigeminal neuralgia: Secondary | ICD-10-CM | POA: Diagnosis not present

## 2022-02-06 MED ORDER — CARBAMAZEPINE ER 200 MG PO TB12: 200.0000 mg | ORAL_TABLET | Freq: Two times a day (BID) | ORAL | 3 refills | Status: DC

## 2022-02-06 NOTE — Progress Notes (Signed)
[facility]    Provider:  Dr Jaynee Eagles Requesting Provider: Eliane Decree, DMD Primary Care Provider:  Colon Branch, MD  CC:  facial pain  HPI:  Jacob Ray is a 72 y.o. male here as requested by Eliane Decree, DMD for facial pain  Few years ago he had a root canal on the right beck of the jaw, pain came back after the root canal, like a nevre shooting through his jaw on the right, sent to endodontist and not root canal, went to an endodontics. 30 days ago when he would talk he would have a constant nerve shooting under his teeth.shooting down the jawline, numbness, brushing, eating, when he tries to eat it will flare up, brush his teeth he has to be very careful. Brief, severe and daily multiple times. Bit of a pause n between, he had shingles 10 years ago on the same side but symptoms started 2 years ago. No other focal neurologic deficits, associated symptoms, inciting events or modifiable factors.   Reviewed notes, labs and imaging from outside physicians, which showed:     Latest Ref Rng & Units 03/29/2021    9:36 AM 11/02/2019    8:42 AM 06/20/2018    9:37 AM  CBC  WBC 4.0 - 10.5 K/uL 4.1  4.0  4.3   Hemoglobin 13.0 - 17.0 g/dL 14.8  15.5  16.1   Hematocrit 39.0 - 52.0 % 43.6  45.8  45.9   Platelets 150.0 - 400.0 K/uL 221.0  236  235       Latest Ref Rng & Units 12/12/2021    8:24 AM 03/29/2021    9:36 AM 03/03/2020    8:27 AM  CMP  Glucose 70 - 99 mg/dL  93  86   BUN 6 - 23 mg/dL  18  18   Creatinine 0.40 - 1.50 mg/dL  1.10  1.10   Sodium 135 - 145 mEq/L  137  139   Potassium 3.5 - 5.1 mEq/L  4.6  4.8   Chloride 96 - 112 mEq/L  104  106   CO2 19 - 32 mEq/L  28  28   Calcium 8.4 - 10.5 mg/dL  9.2  9.3   Total Protein 6.0 - 8.3 g/dL  6.5    Total Bilirubin 0.2 - 1.2 mg/dL  1.2    Alkaline Phos 39 - 117 U/L  46    AST 0 - 40 IU/L 15  12    ALT 0 - 44 IU/L 15  11         Review of Systems: Patient complains of symptoms per HPI as well  as the following symptoms facial pain. Pertinent negatives and positives per HPI. All others negative.   Social History   Socioeconomic History   Marital status: Married    Spouse name: Not on file   Number of children: 1   Years of education: Not on file   Highest education level: Not on file  Occupational History   Occupation: Press photographer , works full time   Tobacco Use   Smoking status: Never   Smokeless tobacco: Current    Types: Chew   Tobacco comments:    Rarely, sees dentist regularly, not ready to quit   Vaping Use   Vaping Use: Never used  Substance and Sexual Activity   Alcohol use: Yes    Alcohol/week: 8.0 standard drinks of alcohol    Types: 8 Cans of beer per week  Comment: 2 times week    Drug use: No   Sexual activity: Not on file  Other Topics Concern   Not on file  Social History Narrative   Household-- pt and wife   Adult child    Social Determinants of Health   Financial Resource Strain: Not on file  Food Insecurity: Not on file  Transportation Needs: Not on file  Physical Activity: Not on file  Stress: Not on file  Social Connections: Not on file  Intimate Partner Violence: Not on file    Family History  Problem Relation Age of Onset   CAD Father 18   Hypertension Father    Prostate cancer Father 34   Valvular heart disease Father    Valvular heart disease Brother        9   CAD Brother        46   Diabetes Brother    Obesity Brother    Colon cancer Neg Hx        F ?   Colon polyps Neg Hx    Esophageal cancer Neg Hx    Rectal cancer Neg Hx    Stomach cancer Neg Hx     Past Medical History:  Diagnosis Date   Barrett's esophagus    EGD on 05/04/2013, next due in 5/18, [place] (basal cell carcinoma), arm 03/2013   Right forearm   Blepharitis, right eye 2013   BPH (benign prostatic hyperplasia)    GERD (gastroesophageal reflux disease)    Heart murmur    History of Helicobacter pylori infection 05/2013   Mitral  regurgitation 2009   mild per ECHO   Shingles 2013   Varicose veins    bilateral    Patient Active Problem List   Diagnosis Date Noted   Trigeminal neuralgia of right side of face 02/06/2022   BPH (benign prostatic hyperplasia) 07/20/2020   GERD (gastroesophageal reflux disease) 07/20/2020   Heart murmur 07/20/2020   BCC (basal cell carcinoma of skin) 04/27/2019   Dyslipidemia 08/24/2018   CAD (coronary artery disease) 06/20/2018   Barrett esophagus 06/14/2016   PCP NOTES >>>> 11/11/2014   Annual physical exam 24/09/7351   History of Helicobacter pylori infection 05/2013   Blepharitis, right eye 2013   Shingles 2013   Mitral regurgitation 2009    Past Surgical History:  Procedure Laterality Date   COLONOSCOPY     LEFT HEART CATH AND CORONARY ANGIOGRAPHY N/A 07/03/2018   Procedure: LEFT HEART CATH AND CORONARY ANGIOGRAPHY;  Surgeon: [name], [name], MD;  Location: [facility];  Service: Cardiovascular;  Laterality: N/A;   SPIROMETRY  03/18/2013   [facility]   TONSILLECTOMY     UPPER GASTROINTESTINAL ENDOSCOPY     WISDOM TOOTH EXTRACTION      Current Outpatient Medications  Medication Sig Dispense Refill   aspirin EC 81 MG tablet Take 81 mg by mouth daily.      carbamazepine (TEGRETOL-XR) 200 MG 12 hr tablet Take 1 tablet (200 mg total) by mouth 2 (two) times daily. 60 tablet 3   Cholecalciferol (VITAMIN D) 50 MCG (2000 UT) CAPS Take 1 capsule by mouth daily.     nitroGLYCERIN (NITROSTAT) 0.4 MG SL tablet Place 1 tablet (0.4 mg total) under the tongue every 5 (five) minutes x 3 doses as needed for chest pain. 20 tablet 1   pantoprazole (PROTONIX) 20 MG tablet Take 1 tablet (20 mg total) by mouth daily. 90 tablet 3  pravastatin (PRAVACHOL) 20 MG tablet Take 1 tablet (20 mg total) by mouth daily. 90 tablet 3   famotidine (PEPCID) 40 MG tablet Take 1 tablet (40 mg total) by mouth at bedtime. 90 tablet 1   No current facility-administered medications for this  visit.    Allergies as of 02/06/2022   (No Known Allergies)    Vitals: BP 125/85   Pulse (!) 57   Ht '6\' 2"'$  (1.88 m)   Wt 231 lb 6.4 oz (105 kg)   BMI 29.71 kg/m  Last Weight:  Wt Readings from Last 1 Encounters:  02/06/22 231 lb 6.4 oz (105 kg)   Last Height:   Ht Readings from Last 1 Encounters:  02/06/22 '6\' 2"'$  (1.88 m)     Physical exam: Exam: Gen: NAD, conversant, well nourised, well groomed                     CV: RRR, no MRG. No Carotid Bruits. No peripheral edema, warm, nontender Eyes: Conjunctivae clear without exudates or hemorrhage  Neuro: Detailed Neurologic Exam  Speech:    Speech is normal; fluent and spontaneous with normal comprehension.  Cognition:    The patient is oriented to person, place, and time;     recent and remote memory intact;     language fluent;     normal attention, concentration,     fund of knowledge Cranial Nerves:    The pupils are equal, round, and reactive to light.  Attempted, pupils too small to visualize fundi.  Visual fields are full to finger confrontation. Extraocular movements are intact. Trigeminal sensation is intact and the muscles of mastication are normal. The face is symmetric. The palate elevates in the midline. Hearing intact. Voice is normal. Shoulder shrug is normal. The tongue has normal motion without fasciculations.   Coordination: nml  Gait: nml  Motor Observation:    No asymmetry, no atrophy, and no involuntary movements noted. Tone:    Normal muscle tone.    Posture:    Posture is normal. normal erect    Strength:    Strength is V/V in the upper and lower limbs.      Sensation: intact to LT     Reflex Exam:  DTR's:    Deep tendon reflexes in the upper and lower extremities are normal bilaterally.   Toes:    The toes are downgoing bilaterally.   Clonus:    Clonus is absent.    Assessment/Plan:  Patient with Trigeminal Neuralgia  -MRI of the brain trigeminal protocol to evaluate for  stroke, vascular loop, compressive mass -Management: Vascular decompression or other procedures on the nerve  in the nerve send to neurosurgery, medical management (Tegretol), also procedures on the nerve such as gamma knife.  - start Tegretol - we reviewed images online, etiologies, management, possibilities for causes for TGN answered all questions  Orders Placed This Encounter  Procedures   MR FACE/TRIGEMINAL WO/W CM   Basic Metabolic Panel   Meds ordered this encounter  Medications   carbamazepine (TEGRETOL-XR) 200 MG 12 hr tablet    Sig: Take 1 tablet (200 mg total) by mouth 2 (two) times daily.    Dispense:  60 tablet    Refill:  3    Cc: Beavers, Pierce Crane, DMD,  Colon Branch, MD  Sarina Ill, MD  [facility] [address] [place], [place] [address]  Phone [phone] Fax [phone]  I spent over 60 minutes of face-to-face  and non-face-to-face time with patient on the  1. Trigeminal neuralgia of right side of face   2. Right facial numbness   3. Right facial pain    diagnosis.  This included previsit chart review, lab review, study review, order entry, electronic health record documentation, patient education on the different diagnostic and therapeutic options, counseling and coordination of care, risks and benefits of management, compliance, or risk factor reduction

## 2022-02-06 NOTE — Patient Instructions (Addendum)
MRI of the brain trigeminal protocol to evaluate for stroke, vascular loop, compressive mass Management: Vascular decompression or other procedures on the nerve  in the nerve send to neurosurgery, medical management (Tegretol), also procedures on the nerve such as gamma knife.  Tegretol  Meds ordered this encounter  Medications   carbamazepine (TEGRETOL-XR) 200 MG 12 hr tablet    Sig: Take 1 tablet (200 mg total) by mouth 2 (two) times daily.    Dispense:  60 tablet    Refill:  3     Trigeminal Neuralgia  Trigeminal neuralgia is a nerve disorder that causes severe pain on one side of the face. The pain may last from a few seconds to several minutes, but it can happen hundreds of times a day. The pain is usually only on one side of the face. Symptoms may occur for days, weeks, or months and then go away for months or years. The pain may return and be worse than before. What are the causes? This condition may be caused by: Damage or pressure to a nerve in the head that is called the trigeminal nerve. An attack can be triggered by: Talking or chewing. Putting on makeup. Washing, shaving, or touching your face. Brushing your teeth. Blasts of hot or cold air. Primary demyelinating disorders, such as multiple sclerosis. Tumors. What increases the risk? You are more likely to develop this condition if: You are 66-84 years old. You are male. What are the signs or symptoms? The main symptom of this condition is severe pain in the jaw, lips, eyes, nose, scalp, forehead, and face. How is this diagnosed? This condition is diagnosed with a physical exam. A CT scan or an MRI may be done to rule out other conditions that can cause facial pain. How is this treated? This condition may be treated with: Measures to avoid the things that trigger your symptoms. Prescription medicines such as anticonvulsants. Procedures such as ablation, thermal, or radiation therapy. Cognitive or behavioral  therapy. Complementary therapies such as: Gentle, regular exercise or yoga. Meditation. Aromatherapy. Acupuncture. Surgery. This may be done in severe cases if other medical treatment does not provide relief. It may take up to one month for treatment to start relieving the pain. Follow these instructions at home: Managing pain  Learn as much as you can about how to manage your pain. Ask your health care provider if a pain specialist would be helpful. Consider talking with a mental health care provider about how to cope with the pain. Consider joining a pain support group. General instructions Take over-the-counter and prescription medicines only as told by your health care provider. Avoid the things that trigger your symptoms. It may help to: Chew on the unaffected side of your mouth. Avoid touching your face. Avoid blasts of hot or cold air. Keep all follow-up visits. Where to find more information Facial Pain Association: facepain.org Contact a health care provider if: Your medicine is not helping your symptoms. You have side effects from the medicine used for treatment. You develop new, unexplained symptoms, such as: Double vision. Facial weakness or numbness. Changes in hearing or balance. You feel depressed. Get help right away if: Your pain is severe and is not getting better. You develop suicidal thoughts. If you ever feel like you may hurt yourself or others, or have thoughts about taking your own life, get help right away. Go to your nearest emergency department or: Call your local emergency services (911 in the [place] Call a suicide crisis  helpline, such as the [facility] at (470) 673-8297 or 988 in the [place]. This is open 24 hours a day in the [place] Text the Crisis Text Line at 463-023-7310 (in the [place].). Summary Trigeminal neuralgia is a nerve disorder that causes severe pain on one side of the face. The pain may last from a few seconds to several  minutes. This condition is caused by damage or pressure to a nerve in the head that is called the trigeminal nerve. Treatment may include avoiding the things that trigger your symptoms, taking medicines, or having procedures or surgery. It may take up to one month for treatment to start relieving the pain. Keep all follow-up visits. This information is not intended to replace advice given to you by your health care provider. Make sure you discuss any questions you have with your health care provider.  Carbamazepine Tablets What is this medication? CARBAMAZEPINE (kar ba MAZ e peen) prevents and controls seizures in people with epilepsy. It may also be used to treat nerve pain. It works by calming overactive nerves in your body. This medicine may be used for other purposes; ask your health care provider or pharmacist if you have questions. COMMON BRAND NAME(S): Epitol, Tegretol What should I tell my care team before I take this medication? They need to know if you have any of these conditions: Asian ancestry Bone marrow disease Glaucoma Heart disease Irregular heartbeat or rhythm Kidney disease Liver disease Low blood cell levels (white cells, red cells, or platelets) Mental health conditions Porphyria Suicidal thoughts, plans, or attempt by you or a family member An unusual or allergic reaction to carbamazepine, other medications, foods, dyes, or preservatives Pregnant or trying to get pregnant Breastfeeding How should I use this medication? Take this medication by mouth with a glass of water. Follow the directions on the prescription label. Take this medication with food. Take your doses at regular intervals. Do not take your medication more often than directed. Do not stop taking this medication except on the advice of your care team. A special MedGuide will be given to you by the pharmacist with each prescription and refill. Be sure to read this information carefully each time. Talk  to your care team about the use of this medication in children. Special care may be needed. Overdosage: If you think you have taken too much of this medicine contact a poison control center or emergency room at once. NOTE: This medicine is only for you. Do not share this medicine with others. What if I miss a dose? If you miss a dose, take it as soon as you can. If it is almost time for your next dose, take only that dose. Do not take double or extra doses. What may interact with this medication? Do not take this medication with any of the following: Certain medications used to treat HIV infection or AIDS that are given in combination with cobicistat Delavirdine MAOIs like Carbex, Eldepryl, Marplan, Nardil, and Parnate Nefazodone Oxcarbazepine This medication may also interact with the following: Acetaminophen Acetazolamide Barbiturate medications for inducing sleep or treating seizures, like phenobarbital Certain antibiotics like clarithromycin, erythromycin or troleandomycin Cimetidine Cyclosporine Danazol Dicumarol Doxycycline Male hormones, including estrogens and birth control pills Grapefruit juice Isoniazid, INH Levothyroxine and other thyroid hormones Lithium and other medications to treat mood problems or psychotic disturbances Loratadine Medications for angina or high blood pressure Medications for cancer Medications for depression or anxiety Medications for sleep Medications to treat fungal infections, like fluconazole, itraconazole  or ketoconazole Medications used to treat HIV infection or AIDS Methadone Niacinamide Praziquantel Propoxyphene Rifampin or rifabutin Seizure or epilepsy medication Steroid medications such as prednisone or cortisone Theophylline Tramadol Warfarin This list may not describe all possible interactions. Give your health care provider a list of all the medicines, herbs, non-prescription drugs, or dietary supplements you use. Also tell  them if you smoke, drink alcohol, or use illegal drugs. Some items may interact with your medicine. What should I watch for while using this medication? Visit your care team for regular checks on your progress. Do not change brands or dosage forms of this medication without discussing it with your care team. If you are taking this medication for epilepsy (seizures), do not stop taking it suddenly. This increases the risk of seizures. Wear a Probation officer or necklace. Carry an identification card with information about your condition, medications, and care team. This medication may cause serious skin reactions. They can happen weeks to months after starting the medication. Contact your care team right away if you notice fevers or flu-like symptoms with a rash. The rash may be red or purple and then turn into blisters or peeling of the skin. You may also notice a red rash with swelling of the face, lips, or lymph nodes in your neck or under your arms. This medication may affect your coordination, reaction time, or judgment. Do not drive or operate machinery until you know how this medication affects you. Sit up or stand slowly to reduce the risk of dizzy or fainting spells. Drinking alcohol with this medication can increase the risk of these side effects. Estrogen and progestin hormones may not work as well while you are taking this medication. A barrier contraceptive, such as a condom or diaphragm, is recommended if you are using these hormones for contraception. Talk to your care team about effective forms of contraception. This medication can make you more sensitive to the sun. Keep out of the sun. If you cannot avoid being in the sun, wear protective clothing and sunscreen. Do not use sun lamps, tanning beds, or tanning booths. This medication may cause thoughts of suicide or depression. This includes sudden changes in mood, behaviors, or thoughts. These changes can happen at any time but are more  common in the beginning of treatment or after a change in dose. Call your care team right away if you experience these thoughts or worsening depression. Women who become pregnant while using this medication may enroll in the [place] Pregnancy Registry by calling [phone]. This registry collects information about the safety of antiepileptic medication use during pregnancy. This medication may cause a decrease in vitamin D and folic acid. You should make sure that you get enough vitamins while you are taking this medication. Discuss the foods you eat and the vitamins you take with your care team. What side effects may I notice from receiving this medication? Side effects that you should report to your care team as soon as possible: Allergic reactions--skin rash, itching, hives, swelling of the face, lips, tongue, or throat Aplastic anemia--unusual weakness or fatigue, dizziness, headache, trouble breathing, increased bleeding or bruising Change in vision Heart rhythm changes--fast or irregular heartbeat, dizziness, feeling faint or lightheaded, chest pain, trouble breathing Infection--fever, chills, cough, or sore throat Liver injury--right upper belly pain, loss of appetite, nausea, light-colored stool, dark yellow or brown urine, yellowing skin or eyes, unusual weakness or fatigue Low sodium level--muscle weakness, fatigue, dizziness, headache, confusion Rash, fever, and  swollen lymph nodes Redness, blistering, peeling or loosening of the skin, including inside the mouth Thoughts of suicide or self-harm, worsening mood, feelings of depression Side effects that usually do not require medical attention (report to your care team if they continue or are bothersome): Dizziness Drowsiness Loss of balance or coordination Nausea Vomiting This list may not describe all possible side effects. Call your doctor for medical advice about side effects. You may report side  effects to FDA at 1-800-FDA-1088. Where should I keep my medication? Keep out of reach of children. Store at room temperature below 30 degrees C (86 degrees F). Keep container tightly closed. Protect from moisture. Throw away any unused medication after the expiration date. NOTE: This sheet is a summary. It may not cover all possible information. If you have questions about this medicine, talk to your doctor, pharmacist, or health care provider.  2023 Elsevier/Gold Standard (2021-07-11 00:00:00)  Document Revised: 07/14/2020 Document Reviewed: 06/13/2020 Elsevier Patient Education  [place].

## 2022-02-07 LAB — BASIC METABOLIC PANEL
BUN/Creatinine Ratio: 14 (ref 10–24)
BUN: 16 mg/dL (ref 8–27)
CO2: 23 mmol/L (ref 20–29)
Calcium: 9.9 mg/dL (ref 8.6–10.2)
Chloride: 101 mmol/L (ref 96–106)
Creatinine, Ser: 1.14 mg/dL (ref 0.76–1.27)
Glucose: 95 mg/dL (ref 70–99)
Potassium: 4.8 mmol/L (ref 3.5–5.2)
Sodium: 140 mmol/L (ref 134–144)
eGFR: 70 mL/min/{1.73_m2} (ref >59)

## 2022-02-08 ENCOUNTER — Telehealth: Payer: Self-pay | Admitting: Neurology

## 2022-02-08 NOTE — Telephone Encounter (Signed)
[employer] NPR via [employer] [facility] sent to [facility] [phone]

## 2022-02-15 ENCOUNTER — Ambulatory Visit
Admission: RE | Admit: 2022-02-15 | Discharge: 2022-02-15 | Disposition: A | Discharge status: Home / Self Care | Payer: BC Managed Care – PPO | Source: Ambulatory Visit | Attending: Neurology | Admitting: Neurology

## 2022-02-15 DIAGNOSIS — R200 Anesthesia of skin: Secondary | ICD-10-CM

## 2022-02-15 DIAGNOSIS — G500 Trigeminal neuralgia: Secondary | ICD-10-CM

## 2022-02-15 DIAGNOSIS — R519 Headache, unspecified: Secondary | ICD-10-CM

## 2022-02-15 MED ORDER — GADOPICLENOL 0.5 MMOL/ML IV SOLN
10.0000 mL | Freq: Once | INTRAVENOUS | Status: AC | PRN
  Administered 2022-02-15: 10 mL via INTRAVENOUS

## 2022-02-19 ENCOUNTER — Telehealth: Payer: Self-pay | Admitting: *Deleted

## 2022-02-19 DIAGNOSIS — G500 Trigeminal neuralgia: Secondary | ICD-10-CM

## 2022-02-19 DIAGNOSIS — R519 Headache, unspecified: Secondary | ICD-10-CM

## 2022-02-19 DIAGNOSIS — R200 Anesthesia of skin: Secondary | ICD-10-CM

## 2022-02-19 NOTE — Telephone Encounter (Signed)
Spoke with patient and discussed MRI trigeminal results. Patient is amenable to proceeding with referral to Dr Colleen Can office. Patient verbalized appreciation for the call and will watch for a call from [facility].  Referral placed to Dr Zada Finders.

## 2022-02-19 NOTE — Telephone Encounter (Signed)
-----   Message from Melvenia Beam, MD sent at 02/19/2022  1:03 PM EST ----- The MRI trigeminal nerve came back normal but sometimes I don't trust the results it is so hard to see, I'd like to send you to Dr. Zada Finders for another opinion and for you to talk to him about options for treatment. I will ask my staff to call you about it thanks! If he approved, send to dr Zada Finders for evaluation ot TGN

## 2022-02-20 ENCOUNTER — Telehealth: Payer: Self-pay | Admitting: Neurology

## 2022-02-20 NOTE — Telephone Encounter (Signed)
Referral for neurosurgery fax to [facility]. Phone: [phone], Fax: [phone].

## 2022-02-28 DIAGNOSIS — G500 Trigeminal neuralgia: Secondary | ICD-10-CM | POA: Diagnosis not present

## 2022-02-28 DIAGNOSIS — Z6829 Body mass index (BMI) 29.0-29.9, adult: Secondary | ICD-10-CM | POA: Diagnosis not present

## 2022-03-21 ENCOUNTER — Encounter: Payer: Self-pay | Admitting: Neurology

## 2022-03-21 ENCOUNTER — Ambulatory Visit (INDEPENDENT_AMBULATORY_CARE_PROVIDER_SITE_OTHER): Payer: BC Managed Care – PPO | Admitting: Neurology

## 2022-03-21 VITALS — BP 100/70 | HR 64 | Ht 74.0 in | Wt 235.0 lb

## 2022-03-21 DIAGNOSIS — R0681 Apnea, not elsewhere classified: Secondary | ICD-10-CM | POA: Diagnosis not present

## 2022-03-21 DIAGNOSIS — Z79899 Other long term (current) drug therapy: Secondary | ICD-10-CM

## 2022-03-21 DIAGNOSIS — G500 Trigeminal neuralgia: Secondary | ICD-10-CM | POA: Diagnosis not present

## 2022-03-21 DIAGNOSIS — G4719 Other hypersomnia: Secondary | ICD-10-CM

## 2022-03-21 MED ORDER — CARBAMAZEPINE ER 100 MG PO TB12: 100.0000 mg | ORAL_TABLET | Freq: Two times a day (BID) | ORAL | 6 refills | Status: DC

## 2022-03-21 NOTE — Progress Notes (Signed)
Epworth Sleepiness Scale 0= would never doze 1= slight chance of dozing 2= moderate chance of dozing 3= high chance of dozing  Sitting and reading 2 Watching TV:3 Sitting inactive in a public place (ex. Theater or meeting):3 As a passenger in a car for an hour without a break:2 Lying down to rest in the afternoon:3 Sitting and talking to someone:0 Sitting quietly after lunch (no alcohol):2 In a car, while stopped in traffic:0 Total:15

## 2022-03-21 NOTE — Progress Notes (Signed)
[id number]    Provider:  Dr Jaynee Eagles Requesting Provider: Colon Branch, MD Primary Care Provider:  Colon Branch, MD  CC:  facial pain  03/21/2022: Saw neurosurgery. Options were medicine or decompression of the TGN. Medicine makes him sleepy but it works. Was never sleepy before the medicine. He is also snoring more, stops breathing at night per wife, has dry mouth, no morning headaches. Wants a sleep study, falls asleep, excessive fatigue, will send to Dr. Brett Fairy for sleep eval and ESS today. Malampati 4. Discussed, he would like neurosurgery.   Patient complains of symptoms per HPI as well as the following symptoms: TGN . Pertinent negatives and positives per HPI. All others negative   HPI:  Jacob Ray is a 72 y.o. male here as requested by Colon Branch, MD for facial pain  Few years ago he had a root canal on the right beck of the jaw, pain came back after the root canal, like a nevre shooting through his jaw on the right, sent to endodontist and not root canal, went to an endodontics. 30 days ago when he would talk he would have a constant nerve shooting under his teeth.shooting down the jawline, numbness, brushing, eating, when he tries to eat it will flare up, brush his teeth he has to be very careful. Brief, severe and daily multiple times. Bit of a pause n between, he had shingles 10 years ago on the same side but symptoms started 2 years ago. No other focal neurologic deficits, associated symptoms, inciting events or modifiable factors.   Reviewed notes, labs and imaging from outside physicians, which showed:     Latest Ref Rng & Units 03/21/2022    3:27 PM 03/29/2021    9:36 AM 11/02/2019    8:42 AM  CBC  WBC 3.4 - 10.8 x10E3/uL 5.3  4.1  4.0   Hemoglobin 13.0 - 17.7 g/dL 15.2  14.8  15.5   Hematocrit 37.5 - 51.0 % 44.1  43.6  45.8   Platelets 150 - 450 x10E3/uL 234  221.0  236       Latest Ref Rng & Units 03/21/2022    3:27 PM 02/06/2022   11:19 AM  12/12/2021    8:24 AM  CMP  Glucose 70 - 99 mg/dL 93  95    BUN 8 - 27 mg/dL 22  16    Creatinine 0.76 - 1.27 mg/dL 1.32  1.14    Sodium 134 - 144 mmol/L 143  140    Potassium 3.5 - 5.2 mmol/L 4.8  4.8    Chloride 96 - 106 mmol/L 104  101    CO2 20 - 29 mmol/L 25  23    Calcium 8.6 - 10.2 mg/dL 9.7  9.9    Total Protein 6.0 - 8.5 g/dL 6.9     Total Bilirubin 0.0 - 1.2 mg/dL 0.4     Alkaline Phos 44 - 121 IU/L 61     AST 0 - 40 IU/L 11   15   ALT 0 - 44 IU/L 13   15        Review of Systems: Patient complains of symptoms per HPI as well as the following symptoms facial pain. Pertinent negatives and positives per HPI. All others negative.   Social History   Socioeconomic History   Marital status: Married    Spouse name: Not on file   Number of children: 1   Years of education: Not on file  Highest education level: Not on file  Occupational History   Occupation: Press photographer , works full time   Tobacco Use   Smoking status: Never   Smokeless tobacco: Current    Types: Chew   Tobacco comments:    Rarely, sees dentist regularly, not ready to quit   Vaping Use   Vaping Use: Never used  Substance and Sexual Activity   Alcohol use: Yes    Alcohol/week: 8.0 standard drinks of alcohol    Types: 8 Cans of beer per week    Comment: 2 times week    Drug use: No   Sexual activity: Not on file  Other Topics Concern   Not on file  Social History Narrative   Household-- pt and wife   Adult child    Caffeine: 1 cup/day    Social Determinants of Health   Financial Resource Strain: Not on file  Food Insecurity: Not on file  Transportation Needs: Not on file  Physical Activity: Not on file  Stress: Not on file  Social Connections: Not on file  Intimate Partner Violence: Not on file    Family History  Problem Relation Age of Onset   CAD Father 44   Hypertension Father    Prostate cancer Father 24   Valvular heart disease Father    Valvular heart disease Brother         6   CAD Brother        76   Diabetes Brother    Obesity Brother    Colon cancer Neg Hx        F ?   Colon polyps Neg Hx    Esophageal cancer Neg Hx    Rectal cancer Neg Hx    Stomach cancer Neg Hx     Past Medical History:  Diagnosis Date   Barrett's esophagus    EGD on 05/04/2013, next due in 5/18, [place] (basal cell carcinoma), arm 03/2013   Right forearm   Blepharitis, right eye 2013   BPH (benign prostatic hyperplasia)    GERD (gastroesophageal reflux disease)    Heart murmur    History of Helicobacter pylori infection 05/2013   Mitral regurgitation 2009   mild per ECHO   Shingles 2013   Varicose veins    bilateral    Patient Active Problem List   Diagnosis Date Noted   Trigeminal neuralgia 03/26/2022   Trigeminal neuralgia of right side of face 02/06/2022   BPH (benign prostatic hyperplasia) 07/20/2020   GERD (gastroesophageal reflux disease) 07/20/2020   Heart murmur 07/20/2020   BCC (basal cell carcinoma of skin) 04/27/2019   Dyslipidemia 08/24/2018   CAD (coronary artery disease) 06/20/2018   Barrett esophagus 06/14/2016   PCP NOTES >>>> 11/11/2014   Annual physical exam [id number]   History of Helicobacter pylori infection 05/2013   Blepharitis, right eye 2013   Shingles 2013   Mitral regurgitation 2009    Past Surgical History:  Procedure Laterality Date   COLONOSCOPY     LEFT HEART CATH AND CORONARY ANGIOGRAPHY N/A 07/03/2018   Procedure: LEFT HEART CATH AND CORONARY ANGIOGRAPHY;  Surgeon: [name], [name], MD;  Location: [facility];  Service: Cardiovascular;  Laterality: N/A;   SPIROMETRY  03/18/2013   [facility]   TONSILLECTOMY     UPPER GASTROINTESTINAL ENDOSCOPY     WISDOM TOOTH EXTRACTION      Current Outpatient Medications  Medication Sig Dispense Refill   aspirin EC 81 MG  tablet Take 81 mg by mouth daily.      carbamazepine (TEGRETOL-XR) 100 MG 12 hr tablet Take 1 tablet (100 mg total) by mouth 2 (two) times  daily. 60 tablet 6   nitroGLYCERIN (NITROSTAT) 0.4 MG SL tablet Place 1 tablet (0.4 mg total) under the tongue every 5 (five) minutes x 3 doses as needed for chest pain. 20 tablet 1   pravastatin (PRAVACHOL) 20 MG tablet Take 1 tablet (20 mg total) by mouth daily. 90 tablet 3   No current facility-administered medications for this visit.    Allergies as of 03/21/2022   (No Known Allergies)    Vitals: BP 100/70 (BP Location: Right Arm, Patient Position: Sitting, Cuff Size: Large)   Pulse 64   Ht 6\' 2"  (1.88 m)   Wt 235 lb (106.6 kg)   BMI 30.17 kg/m  Last Weight:  Wt Readings from Last 1 Encounters:  03/21/22 235 lb (106.6 kg)   Last Height:   Ht Readings from Last 1 Encounters:  03/21/22 6\' 2"  (1.88 m)     Physical exam: Exam: Gen: NAD, conversant, well nourised, well groomed                     CV: RRR, no MRG. No Carotid Bruits. No peripheral edema, warm, nontender Eyes: Conjunctivae clear without exudates or hemorrhage  Neuro: Detailed Neurologic Exam  Speech:    Speech is normal; fluent and spontaneous with normal comprehension.  Cognition:    The patient is oriented to person, place, and time;     recent and remote memory intact;     language fluent;     normal attention, concentration,     fund of knowledge Cranial Nerves:    The pupils are equal, round, and reactive to light.  Attempted, pupils too small to visualize fundi.  Visual fields are full to finger confrontation. Extraocular movements are intact. Trigeminal sensation is intact and the muscles of mastication are normal. The face is symmetric. The palate elevates in the midline. Hearing intact. Voice is normal. Shoulder shrug is normal. The tongue has normal motion without fasciculations.   Coordination: nml  Gait: nml  Motor Observation:    No asymmetry, no atrophy, and no involuntary movements noted. Tone:    Normal muscle tone.    Posture:    Posture is normal. normal erect    Strength:     Strength is V/V in the upper and lower limbs.      Sensation: intact to LT     Reflex Exam:  DTR's:    Deep tendon reflexes in the upper and lower extremities are normal bilaterally.   Toes:    The toes are downgoing bilaterally.   Clonus:    Clonus is absent.    Assessment/Plan:  Patient with Trigeminal Neuralgia; Saw neurosurgery. Options were medicine or decompression of the TGN. Medicine makes him sleepy but it works. Was never sleepy before the medicine. He is also snoring more, stops breathing at night per wife, has dry mouth, no morning headaches. Wants a sleep study, falls asleep, excessive fatigue, will send to Dr. Brett Fairy for sleep eval and ESS today. Malampati 4. Discussed, he would like neurosurgery.    He is also snoring more, stops breathing at night per wife, has dry mouth, no morning headaches. Wants a sleep study, will send to Dr. Brett Fairy for sleep eval and ESS today. Malampati 4. Sleep evaluation. - decrease Tegretol  Decrease tegretol to 100mg  twice a  day Sleep evaluation Contact dr Zada Finders he would like decompression for TGN bloodwork  Orders Placed This Encounter  Procedures   CBC with Differential/Platelets   Comprehensive metabolic panel   Carbamazepine level, total   Ambulatory referral to Sleep Studies   Ambulatory referral to Neurosurgery   Meds ordered this encounter  Medications   carbamazepine (TEGRETOL-XR) 100 MG 12 hr tablet    Sig: Take 1 tablet (100 mg total) by mouth 2 (two) times daily.    Dispense:  60 tablet    Refill:  6    Cc: Colon Branch, MD,  Colon Branch, MD  Sarina Ill, MD  [facility] [address] [place], [place] [address]  Phone [phone] Fax [phone]  I spent over 30 minutes of face-to-face and non-face-to-face time with patient on the  1. Excessive daytime sleepiness   2. Witnessed apneic spells   3. Long term use of drug   4. Trigeminal neuralgia   5. Trigeminal  neuralgia of right side of face     diagnosis.  This included previsit chart review, lab review, study review, order entry, electronic health record documentation, patient education on the different diagnostic and therapeutic options, counseling and coordination of care, risks and benefits of management, compliance, or risk factor reduction

## 2022-03-21 NOTE — Patient Instructions (Addendum)
Decrease tegretol to 100mg  twice a day Sleep evaluation Contact dr Zada Finders he would like decompression for TGN bloodwork  Meds ordered this encounter  Medications   carbamazepine (TEGRETOL-XR) 100 MG 12 hr tablet    Sig: Take 1 tablet (100 mg total) by mouth 2 (two) times daily.    Dispense:  60 tablet    Refill:  6   Orders Placed This Encounter  Procedures   CBC with Differential/Platelets   Comprehensive metabolic panel   Carbamazepine level, total   Ambulatory referral to Sleep Studies   Trigeminal Neuralgia  Trigeminal neuralgia is a nerve disorder that causes severe pain on one side of the face. The pain may last from a few seconds to several minutes, but it can happen hundreds of times a day. The pain is usually only on one side of the face. Symptoms may occur for days, weeks, or months and then go away for months or years. The pain may return and be worse than before. What are the causes? This condition may be caused by: Damage or pressure to a nerve in the head that is called the trigeminal nerve. An attack can be triggered by: Talking or chewing. Putting on makeup. Washing, shaving, or touching your face. Brushing your teeth. Blasts of hot or cold air. Primary demyelinating disorders, such as multiple sclerosis. Tumors. What increases the risk? You are more likely to develop this condition if: You are 43-20 years old. You are male. What are the signs or symptoms? The main symptom of this condition is severe pain in the jaw, lips, eyes, nose, scalp, forehead, and face. How is this diagnosed? This condition is diagnosed with a physical exam. A CT scan or an MRI may be done to rule out other conditions that can cause facial pain. How is this treated? This condition may be treated with: Measures to avoid the things that trigger your symptoms. Prescription medicines such as anticonvulsants. Procedures such as ablation, thermal, or radiation therapy. Cognitive or  behavioral therapy. Complementary therapies such as: Gentle, regular exercise or yoga. Meditation. Aromatherapy. Acupuncture. Surgery. This may be done in severe cases if other medical treatment does not provide relief. It may take up to one month for treatment to start relieving the pain. Follow these instructions at home: Managing pain  Learn as much as you can about how to manage your pain. Ask your health care provider if a pain specialist would be helpful. Consider talking with a mental health care provider about how to cope with the pain. Consider joining a pain support group. General instructions Take over-the-counter and prescription medicines only as told by your health care provider. Avoid the things that trigger your symptoms. It may help to: Chew on the unaffected side of your mouth. Avoid touching your face. Avoid blasts of hot or cold air. Keep all follow-up visits. Where to find more information Facial Pain Association: facepain.org Contact a health care provider if: Your medicine is not helping your symptoms. You have side effects from the medicine used for treatment. You develop new, unexplained symptoms, such as: Double vision. Facial weakness or numbness. Changes in hearing or balance. You feel depressed. Get help right away if: Your pain is severe and is not getting better. You develop suicidal thoughts. If you ever feel like you may hurt yourself or others, or have thoughts about taking your own life, get help right away. Go to your nearest emergency department or: Call your local emergency services (911 in the [place] Call  a suicide crisis helpline, such as the [place] at 236-628-6417 or 988 in the [place]. This is open 24 hours a day in the [place] Text the Crisis Text Line at 3474289826 (in the [place].). Summary Trigeminal neuralgia is a nerve disorder that causes severe pain on one side of the face. The pain may last from a few seconds  to several minutes. This condition is caused by damage or pressure to a nerve in the head that is called the trigeminal nerve. Treatment may include avoiding the things that trigger your symptoms, taking medicines, or having procedures or surgery. It may take up to one month for treatment to start relieving the pain. Keep all follow-up visits. This information is not intended to replace advice given to you by your health care provider. Make sure you discuss any questions you have with your health care provider. Document Revised: 07/14/2020 Document Reviewed: 06/13/2020 Elsevier Patient Education  [place].

## 2022-03-22 LAB — CBC WITH DIFFERENTIAL/PLATELET
Basophils Absolute: 0 10*3/uL (ref 0.0–0.2)
Basos: 1 %
EOS (ABSOLUTE): 0.2 10*3/uL (ref 0.0–0.4)
Eos: 4 %
Hematocrit: 44.1 % (ref 37.5–51.0)
Hemoglobin: 15.2 g/dL (ref 13.0–17.7)
Immature Grans (Abs): 0 10*3/uL (ref 0.0–0.1)
Immature Granulocytes: 0 %
Lymphocytes Absolute: 1.7 10*3/uL (ref 0.7–3.1)
Lymphs: 32 %
MCH: 31.7 pg (ref 26.6–33.0)
MCHC: 34.5 g/dL (ref 31.5–35.7)
MCV: 92 fL (ref 79–97)
Monocytes Absolute: 0.5 10*3/uL (ref 0.1–0.9)
Monocytes: 10 %
Neutrophils Absolute: 2.8 10*3/uL (ref 1.4–7.0)
Neutrophils: 53 %
Platelets: 234 10*3/uL (ref 150–450)
RBC: 4.8 x10E6/uL (ref 4.14–5.80)
RDW: 12.3 % (ref 11.6–15.4)
WBC: 5.3 10*3/uL (ref 3.4–10.8)

## 2022-03-22 LAB — COMPREHENSIVE METABOLIC PANEL
ALT: 13 IU/L (ref 0–44)
AST: 11 IU/L (ref 0–40)
Albumin/Globulin Ratio: 1.7 (ref 1.2–2.2)
Albumin: 4.3 g/dL (ref 3.9–4.9)
Alkaline Phosphatase: 61 IU/L (ref 44–121)
BUN/Creatinine Ratio: 17 (ref 10–24)
BUN: 22 mg/dL (ref 8–27)
Bilirubin Total: 0.4 mg/dL (ref 0.0–1.2)
CO2: 25 mmol/L (ref 20–29)
Calcium: 9.7 mg/dL (ref 8.6–10.2)
Chloride: 104 mmol/L (ref 96–106)
Creatinine, Ser: 1.32 mg/dL — ABNORMAL HIGH (ref 0.76–1.27)
Globulin, Total: 2.6 g/dL (ref 1.5–4.5)
Glucose: 93 mg/dL (ref 70–99)
Potassium: 4.8 mmol/L (ref 3.5–5.2)
Sodium: 143 mmol/L (ref 134–144)
Total Protein: 6.9 g/dL (ref 6.0–8.5)
eGFR: 58 mL/min/{1.73_m2} — ABNORMAL LOW (ref >59)

## 2022-03-22 LAB — CARBAMAZEPINE LEVEL, TOTAL: Carbamazepine (Tegretol), S: 5.8 ug/mL (ref 4.0–12.0)

## 2022-03-26 ENCOUNTER — Encounter: Payer: Self-pay | Admitting: Neurology

## 2022-03-26 ENCOUNTER — Telehealth: Payer: Self-pay | Admitting: Neurology

## 2022-03-26 DIAGNOSIS — G500 Trigeminal neuralgia: Secondary | ICD-10-CM | POA: Insufficient documentation

## 2022-03-26 NOTE — Telephone Encounter (Signed)
Referral sent to Dr. Ostergard at [facility], phone # [phone]. 

## 2022-04-09 ENCOUNTER — Encounter: Payer: BC Managed Care – PPO | Admitting: Internal Medicine

## 2022-04-17 ENCOUNTER — Other Ambulatory Visit: Payer: Self-pay | Admitting: Neurological Surgery

## 2022-04-20 ENCOUNTER — Telehealth: Payer: Self-pay

## 2022-04-20 NOTE — Telephone Encounter (Signed)
Left message for the to call back to set up tele pre op appt.

## 2022-04-20 NOTE — Telephone Encounter (Signed)
Primary Cardiologist:Brian Dulce Sellar, MD   Preoperative team, please contact this patient and set up a phone call appointment for further preoperative risk assessment. Please obtain consent and complete medication review. Thank you for your help.   I confirm that guidance regarding antiplatelet and oral anticoagulation therapy has been completed and, if necessary, noted below.   Levi Aland, NP-C  04/20/2022, 4:24 PM [address] Office [phone] Fax [phone]

## 2022-04-20 NOTE — Telephone Encounter (Signed)
   [facility] Pre-operative Risk Assessment    Request for surgical clearance:  What type of surgery is being performed? Right Microvascular decompression of the fith carnial nerve    When is this surgery scheduled? 04/30/2022   What type of clearance is required (medical clearance vs. Pharmacy clearance to hold med vs. Both)? Both  Are there any medications that need to be held prior to surgery and how long?Not specified   Practice name and name of physician performing surgery? Dr. Melanee Spry  at [facility]  What is your office phone number: [phone]    7.   What is your office fax number: [phone] Nolene Bernheim  8.   Anesthesia type (None, local, MAC, general) ? General Anesthesia   Tiburcio Pea Armetta Henri 04/20/2022, 2:27 PM  _________________________________________________________________   (provider comments below)

## 2022-04-20 NOTE — Progress Notes (Signed)
Surgical Instructions   Your procedure is scheduled on Tuesday, 05/01/22 at 9:53 AM.  Report to [facility] Main Entrance "A" at 7:50 AM, then check in with the Admitting office.  Call this number if you have problems the morning of surgery:  [phone]   If you have any questions prior to your surgery date call [phone]: Open Monday-Friday 8am-4pm If you experience any cold or flu symptoms such as cough, fever, chills, shortness of breath, etc. between now and your scheduled surgery, please notify us at the above number    Remember:  Do not eat after midnight the night before your surgery  You may drink clear liquids until 6:50 AM the morning of your surgery.   Clear liquids allowed are: Water, Non-Citrus Juices (without pulp), Carbonated Beverages, Clear Tea, Black Coffee ONLY (NO MILK, CREAM OR POWDERED CREAMER of any kind), and Gatorade    Take these medicines the morning of surgery with A SIP OF WATER: Carbamazepine (Tegretol), Pravastatin (Pravachol), Nitroglycerin - if needed  As of today, STOP taking any Aspirin (unless otherwise instructed by your surgeon) Aleve, Naproxen, Ibuprofen, Motrin, Advil, Goody's, BC's, all herbal medications, fish oil, and all vitamins.  Do NOT chew tobacco 24 hours prior to surgery.  [facility] is not responsible for any belongings or valuables.     Contacts, glasses, hearing aids, dentures or partials may not be worn into surgery, please bring cases for these belongings   For patients admitted to the hospital, discharge time will be determined by your treatment team.  SURGICAL WAITING ROOM VISITATION Patients having surgery or a procedure may have no more than 2 support people in the waiting area - these visitors may rotate.   Children under the age of 26 must have an adult with them who is not the patient. If the patient needs to stay at the hospital during part of their recovery, the visitor guidelines for inpatient rooms apply. Pre-op  nurse will coordinate an appropriate time for 1 support person to accompany patient in pre-op.  This support person may not rotate.   Please refer to [email or web address] for the visitor guidelines for Inpatients (after your surgery is over and you are in a regular room).   Special instructions:    Oral Hygiene is also important to reduce your risk of infection.  Remember - BRUSH YOUR TEETH THE MORNING OF SURGERY WITH YOUR REGULAR TOOTHPASTE  [facility] [facility]- Preparing For Surgery  Before surgery, you can play an important role. Because skin is not sterile, your skin needs to be as free of germs as possible. You can reduce the number of germs on your skin by washing with CHG (chlorahexidine gluconate) Soap before surgery.  CHG is an antiseptic cleaner which kills germs and bonds with the skin to continue killing germs even after washing.    Please do not use if you have an allergy to CHG or antibacterial soaps. If your skin becomes reddened/irritated stop using the CHG.  Do not shave (including legs and underarms) for at least 48 hours prior to first CHG shower. It is OK to shave your face.  Please follow these instructions carefully.    Shower the NIGHT BEFORE SURGERY and the MORNING OF SURGERY with CHG Soap.   If you chose to wash your hair, wash your hair first as usual with your normal shampoo. After you shampoo, rinse your hair and body thoroughly to remove the shampoo.  Then [employer] and genitals (private parts) with your normal soap  and rinse thoroughly to remove soap.  After that Use CHG Soap as you would any other liquid soap. You can apply CHG directly to the skin and wash gently with a scrungie or a clean washcloth.   Apply the CHG Soap to your body ONLY FROM THE NECK DOWN.  Do not use on open wounds or open sores. Avoid contact with your eyes, ears, mouth and genitals (private parts). Wash Face and genitals (private parts)  with your  normal soap.   Wash thoroughly, paying special attention to the area where your surgery will be performed.  Thoroughly rinse your body with warm water from the neck down.  DO NOT shower/wash with your normal soap after using and rinsing off the CHG Soap.  Pat yourself dry with a CLEAN TOWEL.  Wear CLEAN PAJAMAS to bed the night before surgery  Place CLEAN SHEETS on your bed the night before your surgery  DO NOT SLEEP WITH PETS.   Day of Surgery: Take a shower with CHG soap. Do not wear jewelry. Do not wear lotions, powders, cologne or deodorant. Do not shave 48 hours prior to surgery.  Men may shave face and neck. Do not bring valuables to the hospital Wear Clean/Comfortable clothing the morning of surgery  Remember to brush your teeth WITH YOUR REGULAR TOOTHPASTE.   Please read over the fact sheets that you were given.

## 2022-04-23 ENCOUNTER — Encounter (HOSPITAL_COMMUNITY): Payer: Self-pay

## 2022-04-23 ENCOUNTER — Encounter (HOSPITAL_COMMUNITY)
Admission: RE | Admit: 2022-04-23 | Discharge: 2022-04-23 | Disposition: A | Discharge status: Home / Self Care | Payer: BC Managed Care – PPO | Source: Ambulatory Visit | Attending: Neurological Surgery | Admitting: Neurological Surgery

## 2022-04-23 ENCOUNTER — Other Ambulatory Visit: Payer: Self-pay

## 2022-04-23 VITALS — BP 123/87 | HR 69 | Temp 97.9°F | Resp 18 | Ht 74.0 in | Wt 234.7 lb

## 2022-04-23 DIAGNOSIS — Z01818 Encounter for other preprocedural examination: Secondary | ICD-10-CM | POA: Diagnosis not present

## 2022-04-23 HISTORY — DX: Pneumonia, unspecified organism: J18.9

## 2022-04-23 HISTORY — DX: Personal history of urinary calculi: Z87.442

## 2022-04-23 HISTORY — DX: Atherosclerotic heart disease of native coronary artery without angina pectoris: I25.10

## 2022-04-23 LAB — TYPE AND SCREEN
ABO/RH(D): B POS
Antibody Screen: NEGATIVE

## 2022-04-23 NOTE — Progress Notes (Signed)
PCP - Dr. Drue Novel Cardiologist - Dr Dulce Sellar  EKG - 12/06/21 Cardiac Cath - 07/03/18  Aspirin Instructions: today is last dose per patient  ERAS Protcol - no drink   Anesthesia review: Hx of heart murmur, sees Dr. Dulce Sellar. Cath in 07/2018 showed non obstructive CAD.   Patient denies shortness of breath, fever, cough and chest pain at PAT appointment   All instructions explained to the patient, with a verbal understanding of the material. Patient agrees to go over the instructions while at home for a better understanding. The opportunity to ask questions was provided.

## 2022-04-23 NOTE — Telephone Encounter (Signed)
Left message x 2 for the pt to call back to set up tele pre op appt.

## 2022-04-24 ENCOUNTER — Telehealth: Payer: Self-pay | Admitting: *Deleted

## 2022-04-24 ENCOUNTER — Institutional Professional Consult (permissible substitution): Payer: BC Managed Care – PPO | Admitting: Neurology

## 2022-04-24 NOTE — Telephone Encounter (Signed)
Pt has been scheduled for tele pre op appt 04/27/22 @ 10:40 add on due to procedure date and med hold. Pt does tell me that the surgeon office had instructed him already to begin holding his ASA as of today, his last does of ASA was 04/23/22. Med rec and consent are done.      ADDENDUM TO CLEARANCE REQUEST: PER PT THE SURGEON NEEDS HIM TO HOLD ASA. PT STATES TO ME TODAY THE SURGEON INSTRUCTED HIM TO HOLD ASA AS OF 04/24/22, LAST DOSE WAS TAKEN ON 04/23/22 PER THE PT.

## 2022-04-24 NOTE — Telephone Encounter (Signed)
Pt has been scheduled for tele pre op appt 04/27/22 @ 10:40 add on due to procedure date and med hold. Pt does tell me that the surgeon office had instructed him already to begin holding his ASA as of today, his last does of ASA was 04/23/22. Med rec and consent are done.    Patient Consent for Virtual Visit        Jacob Ray has provided verbal consent on 04/24/2022 for a virtual visit (video or telephone).   CONSENT FOR VIRTUAL VISIT FOR:  Jacob Ray  By participating in this virtual visit I agree to the following:  I hereby voluntarily request, consent and authorize [facility] and its employed or contracted physicians, physician assistants, nurse practitioners or other licensed health care professionals (the Practitioner), to provide me with telemedicine health care services (the "Services") as deemed necessary by the treating Practitioner. I acknowledge and consent to receive the Services by the Practitioner via telemedicine. I understand that the telemedicine visit will involve communicating with the Practitioner through live audiovisual communication technology and the disclosure of certain medical information by electronic transmission. I acknowledge that I have been given the opportunity to request an in-person assessment or other available alternative prior to the telemedicine visit and am voluntarily participating in the telemedicine visit.  I understand that I have the right to withhold or withdraw my consent to the use of telemedicine in the course of my care at any time, without affecting my right to future care or treatment, and that the Practitioner or I may terminate the telemedicine visit at any time. I understand that I have the right to inspect all information obtained and/or recorded in the course of the telemedicine visit and may receive copies of available information for a reasonable fee.  I understand that some of the potential risks of receiving the  Services via telemedicine include:  Delay or interruption in medical evaluation due to technological equipment failure or disruption; Information transmitted may not be sufficient (e.g. poor resolution of images) to allow for appropriate medical decision making by the Practitioner; and/or  In rare instances, security protocols could fail, causing a breach of personal health information.  Furthermore, I acknowledge that it is my responsibility to provide information about my medical history, conditions and care that is complete and accurate to the best of my ability. I acknowledge that Practitioner's advice, recommendations, and/or decision may be based on factors not within their control, such as incomplete or inaccurate data provided by me or distortions of diagnostic images or specimens that may result from electronic transmissions. I understand that the practice of medicine is not an exact science and that Practitioner makes no warranties or guarantees regarding treatment outcomes. I acknowledge that a copy of this consent can be made available to me via my patient portal [facility] MyChart), or I can request a printed copy by calling the office of [facility].    I understand that my insurance will be billed for this visit.   I have read or had this consent read to me. I understand the contents of this consent, which adequately explains the benefits and risks of the Services being provided via telemedicine.  I have been provided ample opportunity to ask questions regarding this consent and the Services and have had my questions answered to my satisfaction. I give my informed consent for the services to be provided through the use of telemedicine in my medical care

## 2022-04-27 ENCOUNTER — Ambulatory Visit: Payer: BC Managed Care – PPO | Attending: Internal Medicine | Admitting: Nurse Practitioner

## 2022-04-27 DIAGNOSIS — Z01810 Encounter for preprocedural cardiovascular examination: Secondary | ICD-10-CM

## 2022-04-27 NOTE — Anesthesia Preprocedure Evaluation (Signed)
Anesthesia Evaluation  Patient identified by MRN, date of birth, ID band Patient awake    Reviewed: Allergy & Precautions, NPO status , Patient's Chart, lab work & pertinent test results  Airway Mallampati: III  TM Distance: >3 FB Neck ROM: Full    Dental  (+) Teeth Intact, Dental Advisory Given, Caps,    Pulmonary neg pulmonary ROS   Pulmonary exam normal breath sounds clear to auscultation       Cardiovascular + CAD  Normal cardiovascular exam+ Valvular Problems/Murmurs MR  Rhythm:Regular Rate:Normal     Neuro/Psych  Neuromuscular disease  negative psych ROS   GI/Hepatic Neg liver ROS,GERD  ,,  Endo/Other  negative endocrine ROS    Renal/GU negative Renal ROS     Musculoskeletal negative musculoskeletal ROS (+)    Abdominal   Peds  Hematology negative hematology ROS (+)   Anesthesia Other Findings Day of surgery medications reviewed with the patient.  Reproductive/Obstetrics                             Lab Results  Component Value Date   WBC 4.0 05/01/2022   HGB 14.7 05/01/2022   HCT 43.0 05/01/2022   MCV 93.5 05/01/2022   PLT 190 05/01/2022   Lab Results  Component Value Date   CREATININE 1.32 (H) 03/21/2022   BUN 22 03/21/2022   NA 143 03/21/2022   K 4.8 03/21/2022   CL 104 03/21/2022   CO2 25 03/21/2022     Anesthesia Physical Anesthesia Plan  ASA: 3  Anesthesia Plan: General   Post-op Pain Management: Minimal or no pain anticipated and Tylenol PO (pre-op)*   Induction: Intravenous  PONV Risk Score and Plan: 2 and Dexamethasone and Ondansetron  Airway Management Planned: Oral ETT  Additional Equipment: None  Intra-op Plan:   Post-operative Plan: Extubation in OR  Informed Consent: I have reviewed the patients History and Physical, chart, labs and discussed the procedure including the risks, benefits and alternatives for the proposed anesthesia with the  patient or authorized representative who has indicated his/her understanding and acceptance.     Dental advisory given  Plan Discussed with: CRNA  Anesthesia Plan Comments: (PAT note by Antionette Poles, PA-C: Follows cardiology for history of nonobstructive CAD by cath 2020, mitral valve regurgitation, HLD.  Seen by Bernadene Person, NP 04/27/2022 for preop evaluation.  Per note, "According to the Revised Cardiac Risk Index (RCRI), his Perioperative Risk of Major Cardiac Event is (%): 0.9. His Functional Capacity in METs is: 8.33 according to the Duke Activity Status Index (DASI). Therefore, based on ACC/AHA guidelines, patient would be at acceptable risk for the planned procedure without further cardiovascular testing. The patient was advised that if he develops new symptoms prior to surgery to contact our office to arrange for a follow-up visit, and he verbalized understanding. Per office protocol, he may hold Aspirin for 5-7 days prior to procedure. Please resume Aspirin as soon as possible postprocedure, at the discretion of the surgeon."  CMP and CBC from 03/21/2022 reviewed, unremarkable.  These labs will be slightly out of the 30-day window at time of surgery.  Preadmission testing RN inadvertently forgot to release repeat labs at patient's appointment.  Given lack of significant findings on lab work, I do not feel strongly the need to be repeated on day of surgery.    EKG 12/06/2021: NSR.  Rate 71.  Cath 07/03/2018: ? Prox LAD lesion is 40% stenosed. ? Mid  LAD to Dist LAD lesion is 30% stenosed. ? The left ventricular systolic function is normal. ? LV end diastolic pressure is normal. ? The left ventricular ejection fraction is 55-65% by visual estimate.   1. Nonobstructive CAD 2. Normal LV function 3. Normal LVEDP  Plan: risk factor modification/medical management.   )        Anesthesia Quick Evaluation

## 2022-04-27 NOTE — Progress Notes (Signed)
Anesthesia Chart Review:  Follows cardiology for history of nonobstructive CAD by cath 2020, mitral valve regurgitation, HLD.  Seen by Bernadene Person, NP 04/27/2022 for preop evaluation.  Per note, "According to the Revised Cardiac Risk Index (RCRI), his Perioperative Risk of Major Cardiac Event is (%): 0.9. His Functional Capacity in METs is: 8.33 according to the Duke Activity Status Index (DASI). Therefore, based on ACC/AHA guidelines, patient would be at acceptable risk for the planned procedure without further cardiovascular testing. The patient was advised that if he develops new symptoms prior to surgery to contact our office to arrange for a follow-up visit, and he verbalized understanding. Per office protocol, he may hold Aspirin for 5-7 days prior to procedure. Please resume Aspirin as soon as possible postprocedure, at the discretion of the surgeon."  CMP and CBC from 03/21/2022 reviewed, unremarkable.  These labs will be slightly out of the 30-day window at time of surgery.  Preadmission testing RN inadvertently forgot to release repeat labs at patient's appointment.  Given lack of significant findings on lab work, I do not feel strongly the need to be repeated on day of surgery.    EKG 12/06/2021: NSR.  Rate 71.  Cath 07/03/2018: Prox LAD lesion is 40% stenosed. Mid LAD to Dist LAD lesion is 30% stenosed. The left ventricular systolic function is normal. LV end diastolic pressure is normal. The left ventricular ejection fraction is 55-65% by visual estimate.   1. Nonobstructive CAD 2. Normal LV function 3. Normal LVEDP   Plan: risk factor modification/medical management.     Zannie Cove [facility] Short Stay Center/Anesthesiology Phone [phone] 04/27/2022 12:11 PM

## 2022-04-27 NOTE — Progress Notes (Signed)
Virtual Visit via Telephone Note   Because of Jacob Ray's co-morbid illnesses, he is at least at moderate risk for complications without adequate follow up.  This format is felt to be most appropriate for this patient at this time.  The patient did not have access to video technology/had technical difficulties with video requiring transitioning to audio format only (telephone).  All issues noted in this document were discussed and addressed.  No physical exam could be performed with this format.  Please refer to the patient's chart for his consent to telehealth for [facility].  Evaluation Performed:  Preoperative cardiovascular risk assessment _____________   Date:  04/27/2022   Patient ID:  Jacob Ray, DOB 1950-11-22, MRN [id number] Patient Location:  Home Provider location:   Office  Primary Care Provider:  Wanda Plump, MD Primary Cardiologist:  Norman Herrlich, MD  Chief Complaint / Patient Profile   72 y.o. y/o male with a h/o CAD, mitral valve regurgitation, dyslipidemia, BPH, and GERD who is pending right microvascular decompression of the fifth cranial nerve on 04/30/2022 with Dr. Autumn Patty of [facility] and presents today for telephonic preoperative cardiovascular risk assessment.  History of Present Illness    Jacob Ray is a 72 y.o. male who presents via audio/video conferencing for a telehealth visit today.  Pt was last seen in cardiology clinic on 12/06/2021 by Dr. Bing Matter.  At that time Jacob Ray was doing well.  The patient is now pending procedure as outlined above. Since his last visit, he has done well from a cardiac standpoint.   He denies chest pain, palpitations, dyspnea, pnd, orthopnea, n, v, dizziness, syncope, edema, weight gain, or early satiety. All other systems reviewed and are otherwise negative except as noted above.   Past Medical History    Past Medical History:  Diagnosis Date    Barrett's esophagus    EGD on 05/04/2013, next due in 5/18, [facility]   BCC (basal cell carcinoma), arm 03/2013   Right forearm   Blepharitis, right eye 2013   BPH (benign prostatic hyperplasia)    Coronary artery disease    Non-obstructive per Dr. Dulce Sellar   GERD (gastroesophageal reflux disease)    Heart murmur    History of Helicobacter pylori infection 05/2013   History of kidney stones    Mitral regurgitation 2009   mild per ECHO   Pneumonia    Shingles 2013   Varicose veins    bilateral   Past Surgical History:  Procedure Laterality Date   COLONOSCOPY     LEFT HEART CATH AND CORONARY ANGIOGRAPHY N/A 07/03/2018   Procedure: LEFT HEART CATH AND CORONARY ANGIOGRAPHY;  Surgeon: [place], [name], MD;  Location: [facility] INVASIVE CV LAB;  Service: Cardiovascular;  Laterality: N/A;   SPIROMETRY  03/18/2013   [facility]   TONSILLECTOMY     UPPER GASTROINTESTINAL ENDOSCOPY     WISDOM TOOTH EXTRACTION      Allergies  No Known Allergies  Home Medications    Prior to Admission medications   Medication Sig Start Date End Date Taking? Authorizing Provider  aspirin EC 81 MG tablet Take 81 mg by mouth daily.     [provider]  carbamazepine (TEGRETOL-XR) 100 MG 12 hr tablet Take 1 tablet (100 mg total) by mouth 2 (two) times daily. 03/21/22   Anson Fret, MD  Multiple Vitamin (MULTIVITAMIN WITH MINERALS) TABS tablet Take 1 tablet by mouth daily.  [provider]  nitroGLYCERIN (NITROSTAT) 0.4 MG SL tablet Place 1 tablet (0.4 mg total) under the tongue every 5 (five) minutes x 3 doses as needed for chest pain. 02/28/18   Baldo Daub, MD  pravastatin (PRAVACHOL) 20 MG tablet Take 1 tablet (20 mg total) by mouth daily. 12/14/21   Georgeanna Lea, MD    Physical Exam    Vital Signs:  Jacob Ray does not have vital signs available for review today.  Given telephonic nature of communication, physical exam is limited. AAOx3. NAD. Normal  affect.  Speech and respirations are unlabored.  Accessory Clinical Findings    None  Assessment & Plan    1.  Preoperative Cardiovascular Risk Assessment:  According to the Revised Cardiac Risk Index (RCRI), his Perioperative Risk of Major Cardiac Event is (%): 0.9. His Functional Capacity in METs is: 8.33 according to the Duke Activity Status Index (DASI). Therefore, based on ACC/AHA guidelines, patient would be at acceptable risk for the planned procedure without further cardiovascular testing.   The patient was advised that if he develops new symptoms prior to surgery to contact our office to arrange for a follow-up visit, and he verbalized understanding.  Per office protocol, he may hold Aspirin for 5-7 days prior to procedure. Please resume Aspirin as soon as possible postprocedure, at the discretion of the surgeon.   A copy of this note will be routed to requesting surgeon.  Time:   Today, I have spent 5 minutes with the patient with telehealth technology discussing medical history, symptoms, and management plan.     Joylene Grapes, NP  04/27/2022, 11:45 AM

## 2022-05-01 ENCOUNTER — Inpatient Hospital Stay (HOSPITAL_COMMUNITY): Payer: BC Managed Care – PPO | Admitting: Physician Assistant

## 2022-05-01 ENCOUNTER — Other Ambulatory Visit: Payer: Self-pay

## 2022-05-01 ENCOUNTER — Encounter (HOSPITAL_COMMUNITY)
Admission: RE | Disposition: A | Discharge status: Home / Self Care | Payer: Self-pay | Source: Home / Self Care | Attending: Neurological Surgery

## 2022-05-01 ENCOUNTER — Encounter (HOSPITAL_COMMUNITY): Payer: Self-pay | Admitting: Neurological Surgery

## 2022-05-01 ENCOUNTER — Inpatient Hospital Stay (HOSPITAL_COMMUNITY)
Admission: RE | Admit: 2022-05-01 | Discharge: 2022-05-02 | DRG: 027 | Disposition: A | Discharge status: Home / Self Care | Payer: BC Managed Care – PPO | Attending: Neurological Surgery | Admitting: Neurological Surgery

## 2022-05-01 DIAGNOSIS — I2510 Atherosclerotic heart disease of native coronary artery without angina pectoris: Secondary | ICD-10-CM | POA: Diagnosis present

## 2022-05-01 DIAGNOSIS — G500 Trigeminal neuralgia: Secondary | ICD-10-CM | POA: Diagnosis not present

## 2022-05-01 DIAGNOSIS — Z87442 Personal history of urinary calculi: Secondary | ICD-10-CM

## 2022-05-01 DIAGNOSIS — N400 Enlarged prostate without lower urinary tract symptoms: Secondary | ICD-10-CM | POA: Diagnosis not present

## 2022-05-01 DIAGNOSIS — Z8619 Personal history of other infectious and parasitic diseases: Secondary | ICD-10-CM

## 2022-05-01 DIAGNOSIS — Z833 Family history of diabetes mellitus: Secondary | ICD-10-CM

## 2022-05-01 DIAGNOSIS — Z8249 Family history of ischemic heart disease and other diseases of the circulatory system: Secondary | ICD-10-CM | POA: Diagnosis not present

## 2022-05-01 DIAGNOSIS — Z01818 Encounter for other preprocedural examination: Secondary | ICD-10-CM

## 2022-05-01 DIAGNOSIS — I340 Nonrheumatic mitral (valve) insufficiency: Secondary | ICD-10-CM | POA: Diagnosis not present

## 2022-05-01 DIAGNOSIS — Z8042 Family history of malignant neoplasm of prostate: Secondary | ICD-10-CM | POA: Diagnosis not present

## 2022-05-01 DIAGNOSIS — Z85828 Personal history of other malignant neoplasm of skin: Secondary | ICD-10-CM | POA: Diagnosis not present

## 2022-05-01 HISTORY — PX: CRANIECTOMY: SHX331

## 2022-05-01 LAB — CBC
HCT: 43 % (ref 39.0–52.0)
Hemoglobin: 14.7 g/dL (ref 13.0–17.0)
MCH: 32 pg (ref 26.0–34.0)
MCHC: 34.2 g/dL (ref 30.0–36.0)
MCV: 93.5 fL (ref 80.0–100.0)
Platelets: 190 10*3/uL (ref 150–400)
RBC: 4.6 MIL/uL (ref 4.22–5.81)
RDW: 12.1 % (ref 11.5–15.5)
WBC: 4 10*3/uL (ref 4.0–10.5)
nRBC: 0 % (ref 0.0–0.2)

## 2022-05-01 LAB — ABO/RH: ABO/RH(D): B POS

## 2022-05-01 LAB — BASIC METABOLIC PANEL
Anion gap: 10 (ref 5–15)
BUN: 19 mg/dL (ref 8–23)
CO2: 21 mmol/L — ABNORMAL LOW (ref 22–32)
Calcium: 8.8 mg/dL — ABNORMAL LOW (ref 8.9–10.3)
Chloride: 106 mmol/L (ref 98–111)
Creatinine, Ser: 1.03 mg/dL (ref 0.61–1.24)
GFR, Estimated: >60 mL/min (ref >60)
Glucose, Bld: 102 mg/dL — ABNORMAL HIGH (ref 70–99)
Potassium: 4.3 mmol/L (ref 3.5–5.1)
Sodium: 137 mmol/L (ref 135–145)

## 2022-05-01 LAB — MRSA NEXT GEN BY PCR, NASAL: MRSA by PCR Next Gen: NOT DETECTED

## 2022-05-01 SURGERY — CRANIECTOMY FOR TIC DOULOUREUX
Anesthesia: General | Laterality: Right

## 2022-05-01 MED ORDER — CEFAZOLIN SODIUM-DEXTROSE 2-4 GM/100ML-% IV SOLN
2.0000 g | INTRAVENOUS | Status: AC
  Administered 2022-05-01: 2 g via INTRAVENOUS
  Filled 2022-05-01: qty 100

## 2022-05-01 MED ORDER — THROMBIN 5000 UNITS EX SOLR
OROMUCOSAL | Status: DC | PRN
  Administered 2022-05-01: 5 mL via TOPICAL

## 2022-05-01 MED ORDER — ACETAMINOPHEN 500 MG PO TABS
1000.0000 mg | ORAL_TABLET | Freq: Once | ORAL | Status: AC
  Administered 2022-05-01: 1000 mg via ORAL
  Filled 2022-05-01: qty 2

## 2022-05-01 MED ORDER — SUGAMMADEX SODIUM 200 MG/2ML IV SOLN
INTRAVENOUS | Status: DC | PRN
  Administered 2022-05-01: 300 mg via INTRAVENOUS

## 2022-05-01 MED ORDER — PROMETHAZINE HCL 25 MG PO TABS
12.5000 mg | ORAL_TABLET | ORAL | Status: DC | PRN
  Administered 2022-05-01: 25 mg via ORAL
  Filled 2022-05-01: qty 1

## 2022-05-01 MED ORDER — CHLORHEXIDINE GLUCONATE 0.12 % MT SOLN
15.0000 mL | Freq: Once | OROMUCOSAL | Status: AC
  Administered 2022-05-01: 15 mL via OROMUCOSAL
  Filled 2022-05-01: qty 15

## 2022-05-01 MED ORDER — LIDOCAINE 2% (20 MG/ML) 5 ML SYRINGE
INTRAMUSCULAR | Status: AC
  Filled 2022-05-01: qty 5

## 2022-05-01 MED ORDER — DEXAMETHASONE SODIUM PHOSPHATE 10 MG/ML IJ SOLN
INTRAMUSCULAR | Status: DC | PRN
  Administered 2022-05-01: 10 mg via INTRAVENOUS

## 2022-05-01 MED ORDER — EPHEDRINE SULFATE-NACL 50-0.9 MG/10ML-% IV SOSY
PREFILLED_SYRINGE | INTRAVENOUS | Status: DC | PRN
  Administered 2022-05-01: 5 mg via INTRAVENOUS

## 2022-05-01 MED ORDER — ONDANSETRON HCL 4 MG/2ML IJ SOLN
4.0000 mg | INTRAMUSCULAR | Status: DC | PRN
  Administered 2022-05-01 – 2022-05-02 (×2): 4 mg via INTRAVENOUS
  Filled 2022-05-01 (×2): qty 2

## 2022-05-01 MED ORDER — LABETALOL HCL 5 MG/ML IV SOLN: 10.0000 mg | INTRAVENOUS | Status: DC | PRN

## 2022-05-01 MED ORDER — HYDROCODONE-ACETAMINOPHEN 5-325 MG PO TABS
1.0000 | ORAL_TABLET | ORAL | Status: DC | PRN
  Administered 2022-05-01 – 2022-05-02 (×4): 1 via ORAL
  Filled 2022-05-01 (×4): qty 1

## 2022-05-01 MED ORDER — FENTANYL CITRATE (PF) 100 MCG/2ML IJ SOLN
25.0000 ug | INTRAMUSCULAR | Status: DC | PRN
  Administered 2022-05-01: 50 ug via INTRAVENOUS

## 2022-05-01 MED ORDER — FENTANYL CITRATE (PF) 100 MCG/2ML IJ SOLN: 25.0000 ug | INTRAMUSCULAR | Status: DC | PRN

## 2022-05-01 MED ORDER — FENTANYL CITRATE (PF) 250 MCG/5ML IJ SOLN
INTRAMUSCULAR | Status: AC
  Filled 2022-05-01: qty 5

## 2022-05-01 MED ORDER — ACETAMINOPHEN 325 MG PO TABS
650.0000 mg | ORAL_TABLET | ORAL | Status: DC | PRN
  Administered 2022-05-01 – 2022-05-02 (×3): 650 mg via ORAL
  Filled 2022-05-01 (×3): qty 2

## 2022-05-01 MED ORDER — LACTATED RINGERS IV SOLN: INTRAVENOUS | Status: DC

## 2022-05-01 MED ORDER — PHENYLEPHRINE HCL-NACL 20-0.9 MG/250ML-% IV SOLN
INTRAVENOUS | Status: DC | PRN
  Administered 2022-05-01: 30 ug/min via INTRAVENOUS

## 2022-05-01 MED ORDER — PROPOFOL 10 MG/ML IV BOLUS
INTRAVENOUS | Status: AC
  Filled 2022-05-01: qty 20

## 2022-05-01 MED ORDER — BACITRACIN ZINC 500 UNIT/GM EX OINT
TOPICAL_OINTMENT | CUTANEOUS | Status: AC
  Filled 2022-05-01: qty 28.35

## 2022-05-01 MED ORDER — ORAL CARE MOUTH RINSE: 15.0000 mL | Freq: Once | OROMUCOSAL | Status: AC

## 2022-05-01 MED ORDER — THROMBIN 20000 UNITS EX SOLR: CUTANEOUS | Status: DC | PRN

## 2022-05-01 MED ORDER — LIDOCAINE 2% (20 MG/ML) 5 ML SYRINGE
INTRAMUSCULAR | Status: DC | PRN
  Administered 2022-05-01: 80 mg via INTRAVENOUS

## 2022-05-01 MED ORDER — LIDOCAINE-EPINEPHRINE 1 %-1:100000 IJ SOLN
INTRAMUSCULAR | Status: AC
  Filled 2022-05-01: qty 1

## 2022-05-01 MED ORDER — ONDANSETRON HCL 4 MG PO TABS: 4.0000 mg | ORAL_TABLET | ORAL | Status: DC | PRN

## 2022-05-01 MED ORDER — THROMBIN 20000 UNITS EX SOLR
CUTANEOUS | Status: AC
  Filled 2022-05-01: qty 20000

## 2022-05-01 MED ORDER — BACITRACIN ZINC 500 UNIT/GM EX OINT
TOPICAL_OINTMENT | CUTANEOUS | Status: DC | PRN
  Administered 2022-05-01: 1 via TOPICAL

## 2022-05-01 MED ORDER — ONDANSETRON HCL 4 MG/2ML IJ SOLN: 4.0000 mg | Freq: Once | INTRAMUSCULAR | Status: DC | PRN

## 2022-05-01 MED ORDER — ROCURONIUM BROMIDE 10 MG/ML (PF) SYRINGE
PREFILLED_SYRINGE | INTRAVENOUS | Status: DC | PRN
  Administered 2022-05-01 (×2): 30 mg via INTRAVENOUS
  Administered 2022-05-01: 60 mg via INTRAVENOUS
  Administered 2022-05-01: 20 mg via INTRAVENOUS
  Administered 2022-05-01: 40 mg via INTRAVENOUS

## 2022-05-01 MED ORDER — DOCUSATE SODIUM 100 MG PO CAPS
100.0000 mg | ORAL_CAPSULE | Freq: Two times a day (BID) | ORAL | Status: DC
  Administered 2022-05-01 – 2022-05-02 (×2): 100 mg via ORAL
  Filled 2022-05-01 (×2): qty 1

## 2022-05-01 MED ORDER — THROMBIN 5000 UNITS EX SOLR
CUTANEOUS | Status: AC
  Filled 2022-05-01: qty 5000

## 2022-05-01 MED ORDER — CEFAZOLIN SODIUM-DEXTROSE 2-4 GM/100ML-% IV SOLN
2.0000 g | Freq: Three times a day (TID) | INTRAVENOUS | Status: AC
  Administered 2022-05-01 (×2): 2 g via INTRAVENOUS
  Filled 2022-05-01 (×2): qty 100

## 2022-05-01 MED ORDER — LACTATED RINGERS IV SOLN: INTRAVENOUS | Status: DC | PRN

## 2022-05-01 MED ORDER — FENTANYL CITRATE (PF) 100 MCG/2ML IJ SOLN
INTRAMUSCULAR | Status: AC
  Filled 2022-05-01: qty 2

## 2022-05-01 MED ORDER — AMISULPRIDE (ANTIEMETIC) 5 MG/2ML IV SOLN: 10.0000 mg | Freq: Once | INTRAVENOUS | Status: DC | PRN

## 2022-05-01 MED ORDER — ONDANSETRON HCL 4 MG/2ML IJ SOLN
INTRAMUSCULAR | Status: DC | PRN
  Administered 2022-05-01: 4 mg via INTRAVENOUS

## 2022-05-01 MED ORDER — FENTANYL CITRATE (PF) 250 MCG/5ML IJ SOLN
INTRAMUSCULAR | Status: DC | PRN
  Administered 2022-05-01 (×2): 25 ug via INTRAVENOUS
  Administered 2022-05-01: 50 ug via INTRAVENOUS
  Administered 2022-05-01: 100 ug via INTRAVENOUS
  Administered 2022-05-01 (×2): 25 ug via INTRAVENOUS

## 2022-05-01 MED ORDER — 0.9 % SODIUM CHLORIDE (POUR BTL) OPTIME
TOPICAL | Status: DC | PRN
  Administered 2022-05-01: 1000 mL

## 2022-05-01 MED ORDER — POLYETHYLENE GLYCOL 3350 17 G PO PACK: 17.0000 g | PACK | Freq: Every day | ORAL | Status: DC | PRN

## 2022-05-01 MED ORDER — CARBAMAZEPINE ER 100 MG PO TB12
100.0000 mg | ORAL_TABLET | Freq: Two times a day (BID) | ORAL | Status: DC
  Administered 2022-05-01 – 2022-05-02 (×2): 100 mg via ORAL
  Filled 2022-05-01 (×2): qty 1

## 2022-05-01 MED ORDER — ROCURONIUM BROMIDE 10 MG/ML (PF) SYRINGE
PREFILLED_SYRINGE | INTRAVENOUS | Status: AC
  Filled 2022-05-01: qty 10

## 2022-05-01 MED ORDER — LIDOCAINE-EPINEPHRINE 1 %-1:100000 IJ SOLN
INTRAMUSCULAR | Status: DC | PRN
  Administered 2022-05-01: 10 mL

## 2022-05-01 MED ORDER — CHLORHEXIDINE GLUCONATE CLOTH 2 % EX PADS: 6.0000 | MEDICATED_PAD | Freq: Once | CUTANEOUS | Status: DC

## 2022-05-01 MED ORDER — MIDAZOLAM HCL 2 MG/2ML IJ SOLN
INTRAMUSCULAR | Status: AC
  Filled 2022-05-01: qty 2

## 2022-05-01 MED ORDER — ACETAMINOPHEN 650 MG RE SUPP: 650.0000 mg | RECTAL | Status: DC | PRN

## 2022-05-01 MED ORDER — HYDROMORPHONE HCL 1 MG/ML IJ SOLN
0.5000 mg | INTRAMUSCULAR | Status: DC | PRN
  Administered 2022-05-01 – 2022-05-02 (×2): 0.5 mg via INTRAVENOUS
  Filled 2022-05-01 (×3): qty 1

## 2022-05-01 MED ORDER — DEXMEDETOMIDINE HCL IN NACL 80 MCG/20ML IV SOLN
INTRAVENOUS | Status: AC
  Filled 2022-05-01: qty 20

## 2022-05-01 MED ORDER — PROPOFOL 10 MG/ML IV BOLUS
INTRAVENOUS | Status: DC | PRN
  Administered 2022-05-01: 60 mg via INTRAVENOUS
  Administered 2022-05-01: 140 mg via INTRAVENOUS

## 2022-05-01 MED ORDER — HEPARIN SODIUM (PORCINE) 5000 UNIT/ML IJ SOLN: 5000.0000 [IU] | Freq: Three times a day (TID) | INTRAMUSCULAR | Status: DC

## 2022-05-01 MED ORDER — PRAVASTATIN SODIUM 10 MG PO TABS
20.0000 mg | ORAL_TABLET | Freq: Every day | ORAL | Status: DC
  Administered 2022-05-02: 20 mg via ORAL
  Filled 2022-05-01: qty 2

## 2022-05-01 SURGICAL SUPPLY — 95 items
ADH SKN CLS APL DERMABOND .7 (GAUZE/BANDAGES/DRESSINGS) ×1
APL SKNCLS STERI-STRIP NONHPOA (GAUZE/BANDAGES/DRESSINGS)
BAG COUNTER SPONGE SURGICOUNT (BAG) ×1 IMPLANT
BAG SPNG CNTER NS LX DISP (BAG) ×1
BAND INSRT 18 STRL LF DISP RB (MISCELLANEOUS)
BAND RUBBER #18 3X1/16 STRL (MISCELLANEOUS) IMPLANT
BENZOIN TINCTURE PRP APPL 2/3 (GAUZE/BANDAGES/DRESSINGS) IMPLANT
BLADE CLIPPER SURG (BLADE) ×1 IMPLANT
BLADE SAW GIGLI 16 STRL (MISCELLANEOUS) IMPLANT
BLADE SURG 15 STRL LF DISP TIS (BLADE) IMPLANT
BLADE SURG 15 STRL SS (BLADE)
BLADE ULTRA TIP 2M (BLADE) ×1 IMPLANT
BNDG CMPR 75X41 PLY HI ABS (GAUZE/BANDAGES/DRESSINGS)
BNDG GAUZE DERMACEA FLUFF 4 (GAUZE/BANDAGES/DRESSINGS) IMPLANT
BNDG GZE DERMACEA 4 6PLY (GAUZE/BANDAGES/DRESSINGS)
BNDG STRETCH 4X75 STRL LF (GAUZE/BANDAGES/DRESSINGS) IMPLANT
BUR ACORN 9.0 PRECISION (BURR) ×1 IMPLANT
BUR ROUND PRECISION 4.0 (BURR) IMPLANT
BUR SPIRAL ROUTER 2.3 (BUR) ×1 IMPLANT
CANISTER SUCT 3000ML PPV (MISCELLANEOUS) ×2 IMPLANT
CATH VENTRIC 35X38 W/TROCAR LG (CATHETERS) IMPLANT
CLIP TI MEDIUM 6 (CLIP) IMPLANT
CNTNR URN SCR LID CUP LEK RST (MISCELLANEOUS) ×1 IMPLANT
CONT SPEC 4OZ STRL OR WHT (MISCELLANEOUS) ×1
COVER BURR HOLE 20 W/TAB UNI (Plate) IMPLANT
COVER MAYO STAND STRL (DRAPES) IMPLANT
DERMABOND ADVANCED .7 DNX12 (GAUZE/BANDAGES/DRESSINGS) IMPLANT
DRAIN SUBARACHNOID (WOUND CARE) IMPLANT
DRAPE HALF SHEET 40X57 (DRAPES) ×1 IMPLANT
DRAPE MICROSCOPE SLANT 54X150 (MISCELLANEOUS) IMPLANT
DRAPE NEUROLOGICAL W/INCISE (DRAPES) ×1 IMPLANT
DRAPE STERI IOBAN 125X83 (DRAPES) IMPLANT
DRAPE SURG 17X23 STRL (DRAPES) IMPLANT
DRAPE WARM FLUID 44X44 (DRAPES) ×1 IMPLANT
DRSG ADAPTIC 3X8 NADH LF (GAUZE/BANDAGES/DRESSINGS) IMPLANT
DRSG TELFA 3X8 NADH STRL (GAUZE/BANDAGES/DRESSINGS) IMPLANT
DURAPREP 6ML APPLICATOR 50/CS (WOUND CARE) ×1 IMPLANT
ELECT REM PT RETURN 9FT ADLT (ELECTROSURGICAL) ×1
ELECTRODE REM PT RTRN 9FT ADLT (ELECTROSURGICAL) ×1 IMPLANT
EVACUATOR 1/8 PVC DRAIN (DRAIN) IMPLANT
EVACUATOR SILICONE 100CC (DRAIN) IMPLANT
FELT TEFLON 1X6 (MISCELLANEOUS) ×1 IMPLANT
FORCEPS BIPOLAR SPETZLER 8 1.0 (NEUROSURGERY SUPPLIES) ×1 IMPLANT
GAUZE 4X4 16PLY ~~LOC~~+RFID DBL (SPONGE) IMPLANT
GAUZE SPONGE 4X4 12PLY STRL (GAUZE/BANDAGES/DRESSINGS) ×1 IMPLANT
GLOVE BIOGEL PI IND STRL 7.5 (GLOVE) ×1 IMPLANT
GLOVE ECLIPSE 7.0 STRL STRAW (GLOVE) ×2 IMPLANT
GLOVE ECLIPSE 7.5 STRL STRAW (GLOVE) IMPLANT
GLOVE EXAM NITRILE XL STR (GLOVE) IMPLANT
GOWN STRL REUS W/ TWL LRG LVL3 (GOWN DISPOSABLE) ×2 IMPLANT
GOWN STRL REUS W/ TWL XL LVL3 (GOWN DISPOSABLE) IMPLANT
GOWN STRL REUS W/TWL 2XL LVL3 (GOWN DISPOSABLE) IMPLANT
GOWN STRL REUS W/TWL LRG LVL3 (GOWN DISPOSABLE) ×5
GOWN STRL REUS W/TWL XL LVL3 (GOWN DISPOSABLE)
HEMOSTAT POWDER KIT SURGIFOAM (HEMOSTASIS) ×1 IMPLANT
HEMOSTAT SURGICEL 2X14 (HEMOSTASIS) IMPLANT
IV NS 1000ML (IV SOLUTION)
IV NS 1000ML BAXH (IV SOLUTION) IMPLANT
KIT BASIN OR (CUSTOM PROCEDURE TRAY) ×1 IMPLANT
KIT DRAIN CSF ACCUDRAIN (MISCELLANEOUS) IMPLANT
KIT TURNOVER KIT B (KITS) ×1 IMPLANT
KNIFE ARACHNOID DISP AM-24-S (MISCELLANEOUS) IMPLANT
NDL HYPO 25X1 1.5 SAFETY (NEEDLE) ×1 IMPLANT
NDL SPNL 18GX3.5 QUINCKE PK (NEEDLE) IMPLANT
NEEDLE HYPO 25X1 1.5 SAFETY (NEEDLE) ×1 IMPLANT
NEEDLE SPNL 18GX3.5 QUINCKE PK (NEEDLE) IMPLANT
NS IRRIG 1000ML POUR BTL (IV SOLUTION) ×3 IMPLANT
PACK CRANIOTOMY CUSTOM (CUSTOM PROCEDURE TRAY) ×1 IMPLANT
PATTIES SURGICAL .25X.25 (GAUZE/BANDAGES/DRESSINGS) IMPLANT
PATTIES SURGICAL .5 X.5 (GAUZE/BANDAGES/DRESSINGS) IMPLANT
PATTIES SURGICAL .5 X3 (DISPOSABLE) IMPLANT
PATTIES SURGICAL 1/4 X 3 (GAUZE/BANDAGES/DRESSINGS) IMPLANT
PATTIES SURGICAL 1X1 (DISPOSABLE) IMPLANT
PIN MAYFIELD SKULL DISP (PIN) ×1 IMPLANT
SCREW UNIII AXS SD 1.5X4 (Screw) IMPLANT
SOL ELECTROSURG ANTI STICK (MISCELLANEOUS) ×1
SOLUTION ELECTROSURG ANTI STCK (MISCELLANEOUS) ×1 IMPLANT
SPECIMEN JAR SMALL (MISCELLANEOUS) IMPLANT
SPIKE FLUID TRANSFER (MISCELLANEOUS) ×1 IMPLANT
SPONGE NEURO XRAY DETECT 1X3 (DISPOSABLE) IMPLANT
SPONGE SURGIFOAM ABS GEL 100 (HEMOSTASIS) ×1 IMPLANT
STAPLER VISISTAT 35W (STAPLE) ×1 IMPLANT
SUT ETHILON 3 0 FSL (SUTURE) IMPLANT
SUT ETHILON 3 0 PS 1 (SUTURE) IMPLANT
SUT MNCRL AB 3-0 PS2 18 (SUTURE) ×2 IMPLANT
SUT NURALON 4 0 TR CR/8 (SUTURE) ×3 IMPLANT
SUT SILK 0 TIES 10X30 (SUTURE) IMPLANT
SUT VIC AB 2-0 CP2 18 (SUTURE) ×2 IMPLANT
TOWEL GREEN STERILE (TOWEL DISPOSABLE) ×1 IMPLANT
TOWEL GREEN STERILE FF (TOWEL DISPOSABLE) ×1 IMPLANT
TRAY FOLEY MTR SLVR 16FR STAT (SET/KITS/TRAYS/PACK) ×1 IMPLANT
TUBE CONNECTING 12X1/4 (SUCTIONS) ×1 IMPLANT
TUBING FEATHERFLOW (TUBING) IMPLANT
UNDERPAD 30X36 HEAVY ABSORB (UNDERPADS AND DIAPERS) ×1 IMPLANT
WATER STERILE IRR 1000ML POUR (IV SOLUTION) ×1 IMPLANT

## 2022-05-01 NOTE — Anesthesia Procedure Notes (Signed)
Procedure Name: Intubation Date/Time: 05/01/2022 10:43 AM  Performed by: Carlos American, CRNAPre-anesthesia Checklist: Patient identified, Emergency Drugs available, Suction available and Patient being monitored Patient Re-evaluated:Patient Re-evaluated prior to induction Oxygen Delivery Method: Circle System Utilized Preoxygenation: Pre-oxygenation with 100% oxygen Induction Type: IV induction Ventilation: Mask ventilation without difficulty Laryngoscope Size: Glidescope and 4 Grade View: Grade I Tube type: Oral Tube size: 7.5 mm Number of attempts: 1 Airway Equipment and Method: Stylet Placement Confirmation: ETT inserted through vocal cords under direct vision, positive ETCO2 and breath sounds checked- equal and bilateral Secured at: 23 cm Tube secured with: Tape Dental Injury: Teeth and Oropharynx as per pre-operative assessment  Comments: Elective Glidescope. Lauren Cozart, SRNA placed ETT under direct supervison. CRNA and MDA at bedside.

## 2022-05-01 NOTE — Progress Notes (Signed)
PT Cancellation Note  Patient Details Name: Jacob Ray MRN: [id number] DOB: [birth year]   Cancelled Treatment:    Reason Eval/Treat Not Completed: Pain limiting ability to participate. Pt with significant head and neck pain, recently received pain medications. RN suggests PT return at a later time. PT will attempt to follow up as time allows.   Arlyss Gandy 05/01/2022, 4:02 PM

## 2022-05-01 NOTE — Transfer of Care (Signed)
Immediate Anesthesia Transfer of Care Note  Patient: Jacob Ray  Procedure(s) Performed: Right Microvascular decompression of the fifth cranial nerve (Right)  Patient Location: PACU  Anesthesia Type:General  Level of Consciousness: drowsy and patient cooperative  Airway & Oxygen Therapy: Patient Spontanous Breathing and Patient connected to face mask oxygen  Post-op Assessment: Report given to RN and Post -op Vital signs reviewed and stable  Post vital signs: Reviewed and stable  Last Vitals:  Vitals Value Taken Time  BP 158/90 05/01/22 1311  Temp    Pulse 79 05/01/22 1314  Resp 15 05/01/22 1314  SpO2 93 % 05/01/22 1314  Vitals shown include unvalidated device data.  Last Pain:  Vitals:   05/01/22 0808  PainSc: 0-No pain      Patients Stated Pain Goal: 0 (05/01/22 1610)  Complications: No notable events documented.

## 2022-05-01 NOTE — Op Note (Signed)
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 He was unable to tolerate the side effects of medications and had persistent symptoms, we discussed options and he wished to proceed with MVD. This was discussed with the patient as well as risks, benefits, and alternatives and wished to proceed with surgery.   OPERATIVE DETAIL: The patient was taken to the operating room and placed on the OR table in the supine position. A formal time out was performed with two patient identifiers and confirmed the operative site. Anesthesia was induced by the anesthesia team. The [place] head holder was applied to the head and secured to the bed with the head turned. Hair was clipped with surgical clippers over the incision and the area was then prepped and draped in a sterile fashion.  A linear incision was placed in the right retrosigmoid region using standard landmarks. Bony landmarks were confirmed and a craniectomy was performed with a high speed drill and rongeurs with exposure of the T-S junction. The dura was opened and the microscope was draped and brought into the field. The trigeminal cistern was entered and CSF was drained to avoid retraction. CN5 was explored with a clear neurovascular conflict due to a branch of SCA contacting the root entry zone ventrally and quite superiorly. SCA was adherent to CN4 and carefully dissected free with care to avoid any manipulation of CN4. Further dissection of this SCA loop allowed me to mobilize this loop off the superior /  ventral side of CN5 and resolved the conflict. A Teflon pledget was shredded and placed between the nerve and artery to prevent recurrence of the conflict. No other neuroconflict was visible and attention was turned to closure.  The wound was copiously irrigated and hemostasis was confirmed. The dura was closed and Adheris was placed along the suture line. Air cells were waxed again to decrease the risk of fistula and a titanium plate was placed over the craniectomy defect.   All instrument and sponge counts were correct, the incision was then closed in layers. The patient was then returned to anesthesia for emergence. No apparent complications at the completion of the procedure.  Alli Consentino PA was scrubbed and assisted with the entire procedure which included exposure, craniotomy/MVD and closure.   EBL:   DRAINS: none   SPECIMENS: none   Jadene Pierini, MD 05/01/22 9:55 AM

## 2022-05-01 NOTE — Anesthesia Postprocedure Evaluation (Signed)
Anesthesia Post Note  Patient: JEQUAN SHAHIN  Procedure(s) Performed: Right Microvascular decompression of the fifth cranial nerve (Right)     Patient location during evaluation: PACU Anesthesia Type: General Level of consciousness: awake and alert Pain management: pain level controlled Vital Signs Assessment: post-procedure vital signs reviewed and stable Respiratory status: spontaneous breathing, nonlabored ventilation, respiratory function stable and patient connected to nasal cannula oxygen Cardiovascular status: blood pressure returned to baseline and stable Postop Assessment: no apparent nausea or vomiting Anesthetic complications: no   No notable events documented.  Last Vitals:  Vitals:   05/01/22 1311 05/01/22 1315  BP: (!) 158/90 (!) 143/89  Pulse: 83 81  Resp: 16 16  Temp: 36.4 C   SpO2: 96% 93%    Last Pain:  Vitals:   05/01/22 1315  PainSc: Asleep                 Collene Schlichter

## 2022-05-01 NOTE — H&P (Signed)
Surgical H&P Update  HPI: 72 y.o. with a history of R TGN and unable to tolerate medications. No changes in health since they were last seen. Still having the above and wishes to proceed with surgery.  PMHx:  Past Medical History:  Diagnosis Date   Barrett's esophagus    EGD on 05/04/2013, next due in 5/18, [facility]   BCC (basal cell carcinoma), arm 03/2013   Right forearm   Blepharitis, right eye 2013   BPH (benign prostatic hyperplasia)    Coronary artery disease    Non-obstructive per Dr. Dulce Sellar   GERD (gastroesophageal reflux disease)    Heart murmur    History of Helicobacter pylori infection 05/2013   History of kidney stones    Mitral regurgitation 2009   mild per ECHO   Pneumonia    Shingles 2013   Varicose veins    bilateral   FamHx:  Family History  Problem Relation Age of Onset   CAD Father 32   Hypertension Father    Prostate cancer Father 60   Valvular heart disease Father    Valvular heart disease Brother        52   CAD Brother        14   Diabetes Brother    Obesity Brother    Colon cancer Neg Hx        F ?   Colon polyps Neg Hx    Esophageal cancer Neg Hx    Rectal cancer Neg Hx    Stomach cancer Neg Hx    SocHx:  reports that he has never smoked. He has never been exposed to tobacco smoke. His smokeless tobacco use includes chew. He reports current alcohol use of about 8.0 standard drinks of alcohol per week. He reports that he does not use drugs.  Physical Exam: Aox3, PERRL, EOMI, FS & SS, Strength 5/5 x4 and SILTx4, incision c/d/i  Assesment/Plan: 72 y.o. man with R TGN, here for R MVD. Risks, benefits, and alternatives discussed and the patient would like to continue with surgery.  -OR today -4N ICU post-op  Jadene Pierini, MD 05/01/22 9:51 AM

## 2022-05-02 ENCOUNTER — Encounter (HOSPITAL_COMMUNITY): Payer: Self-pay | Admitting: Neurological Surgery

## 2022-05-02 MED ORDER — HYDROCODONE-ACETAMINOPHEN 5-325 MG PO TABS: 1.0000 | ORAL_TABLET | ORAL | 0 refills | Status: DC | PRN

## 2022-05-02 NOTE — Progress Notes (Signed)
PT Cancellation Note  Patient Details Name: Jacob Ray MRN: [id number] DOB: March 31, 1950   Cancelled Treatment:    Reason Eval/Treat Not Completed: PT screened, no needs identified, will sign off; OT in the room and reports pt independent with ambulation and negotiated 3 stairs.    Elray Mcgregor 05/02/2022, 9:39 AM Sheran Lawless, PT Acute Rehabilitation Services Office:[phone] 05/02/2022

## 2022-05-02 NOTE — Discharge Summary (Signed)
Discharge Summary  Date of Admission: 05/01/2022  Date of Discharge: 05/02/22  Attending Physician: Autumn Patty, MD  Hospital Course: Patient was admitted following an uncomplicated right retrosigmoid craniotomy for MVD of fifth nerve. They were recovered in PACU and transferred to 4N ICU. Their preop symptoms were improved, their hospital course was uncomplicated and the patient was discharged home today. They will follow up in clinic with me in clinic in 2 weeks.  Neurologic exam at discharge:  Aox3, PERRLA, EOMI, face grossly symmetric without triggerable facial pain, FCx4, no drift, Strength 5/5 x4 and SILTx4, incision c/d/I   Discharge diagnosis: right trigeminal neuralgia   Iran Sizer, PA-C 05/02/22 9:04 AM

## 2022-05-02 NOTE — Evaluation (Signed)
Occupational Therapy Evaluation Patient Details Name: Jacob Ray MRN: [id number] DOB: Apr 23, 1950 Today's Date: 05/02/2022   History of Present Illness 72 y/o male s/p right retrosigmoid craniotomy for MVD of fifth nerve.   Clinical Impression   Patient evaluated by Occupational Therapy with no further acute OT needs identified. All education has been completed and the patient has no further questions. Prior to admit, pt reports that he was independent with all ADL and IADL tasks including functional mobility. Pt works full time with a travel Designer, television/film set position and drives. Wife is at home and able to provide assistance if needed. No follow-up Occupational Therapy or equipment needs. Discussed pt's performance with PT after session. OT is signing off. Thank you for this referral.       Recommendations for follow up therapy are one component of a multi-disciplinary discharge planning process, led by the attending physician.  Recommendations may be updated based on patient status, additional functional criteria and insurance authorization.   Assistance Recommended at Discharge PRN  Patient can return home with the following Assistance with cooking/housework;Assist for transportation    Functional Status Assessment  Patient has not had a recent decline in their functional status  Equipment Recommendations  None recommended by OT       Precautions / Restrictions Precautions Precautions: Fall Restrictions Weight Bearing Restrictions: No      Mobility Bed Mobility Overal bed mobility: Independent     Patient Response: Flat affect, Cooperative  Transfers Overall transfer level: Modified independent Equipment used: None    General transfer comment: slower than typcal pace although no LOB and no physical assist required. Pt able to complete functional mobility in room, bathroom, and hallway. Up/down 3 steps using railing on left side with no difficulty.      Balance Overall  balance assessment: No apparent balance deficits (not formally assessed)     ADL either performed or assessed with clinical judgement   ADL Overall ADL's : Independent;Modified independent;At baseline           Vision Baseline Vision/History:  (wears contacts - implants) Ability to See in Adequate Light: 0 Adequate Patient Visual Report: No change from baseline Vision Assessment?: No apparent visual deficits            Pertinent Vitals/Pain Pain Assessment Pain Assessment: Faces Faces Pain Scale: Hurts a little bit Pain Location: back of head Pain Descriptors / Indicators: Grimacing Pain Intervention(s): Monitored during session, Limited activity within patient's tolerance     Hand Dominance Right   Extremity/Trunk Assessment Upper Extremity Assessment Upper Extremity Assessment: Overall WFL for tasks assessed   Lower Extremity Assessment Lower Extremity Assessment: Overall WFL for tasks assessed   Cervical / Trunk Assessment Cervical / Trunk Assessment: Normal   Communication Communication Communication: No difficulties   Cognition Arousal/Alertness: Awake/alert Behavior During Therapy: WFL for tasks assessed/performed Overall Cognitive Status: Within Functional Limits for tasks assessed          General Comments  SpO2 92-93% on room air. VSS during session            Home Living Family/patient expects to be discharged to:: Private residence Living Arrangements: Spouse/significant other Available Help at Discharge: Family;Available 24 hours/day Type of Home: House Home Access: Stairs to enter [employer] of Steps: 2-3 Entrance Stairs-Rails: Left Home Layout: Multi-level;Full bath on main level;Able to live on main level with bedroom/bathroom Alternate Level Stairs-Number of Steps: Can enter from front door (2-3 steps) or from basement (full flight). Will plan  to go in the front entrance.   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 Independent with ADL/IADL        OT Problem List: Impaired balance (sitting and/or standing)      OT Treatment/Interventions:   Self care/home management   OT Goals(Current goals can be found in the care plan section) Acute Rehab OT Goals Patient Stated Goal: to go home  OT Frequency:  1X visit       AM-PAC OT "6 Clicks" Daily Activity     Outcome Measure Help from another person eating meals?: None Help from another person taking care of personal grooming?: None Help from another person toileting, which includes using toliet, bedpan, or urinal?: None Help from another person bathing (including washing, rinsing, drying)?: None Help from another person to put on and taking off regular upper body clothing?: None Help from another person to put on and taking off regular lower body clothing?: None 6 Click Score: 24   End of Session Equipment Utilized During Treatment: Gait belt Nurse Communication: Mobility status;Other (comment) (SpO2 on RA)  Activity Tolerance: Patient tolerated treatment well Patient left: in chair;with call bell/phone within reach;with family/visitor present  OT Visit Diagnosis: Muscle weakness (generalized) (M62.81)                Time: 4098-1191 OT Time Calculation (min): 31 min Charges:  OT General Charges $OT Visit: 1 Visit OT Evaluation $OT Eval Moderate Complexity: 1 Mod OT Treatments $Self Care/Home Management : 8-22 mins  Jacob Ray, OTR/L,CBIS  Supplemental OT - [facility] and [facility] Secure Chat Preferred     Latorya Bautch, Charisse March 05/02/2022, 9:54 AM

## 2022-05-02 NOTE — Progress Notes (Signed)
Neurosurgery Service Progress Note  Subjective: No acute events overnight. Facial pain has completely resolved. Head and neck pain as expected. No other voiced complaints.    Objective: Vitals:   05/02/22 0500 05/02/22 0600 05/02/22 0700 05/02/22 0800  BP: 118/74 109/69 110/74 (!) 89/42  Pulse: 65 61 60 62  Resp: 17 16 16 17   Temp:    98.1 F (36.7 C)  TempSrc:    Oral  SpO2: 95% 94% 95% 96%  Weight:      Height:        Physical Exam: Aox3, PERRLA, EOMI, face grossly symmetric without triggerable facial pain, FCx4, no drift, Strength 5/5 x4 and SILTx4, incision c/d/I   Assessment & Plan: 72 y.o. male s/p right retrosigmoid craniotomy for MVD on fifth nerve, recovering well.  -PT/OT -discharge home today   Emilee Hero, PA-C 05/02/22 9:01 AM

## 2022-05-03 ENCOUNTER — Telehealth: Payer: Self-pay | Admitting: *Deleted

## 2022-05-03 ENCOUNTER — Encounter: Payer: Self-pay | Admitting: *Deleted

## 2022-05-03 NOTE — Transitions of Care (Post Inpatient/ED Visit) (Signed)
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 How have you been since you were released from the hospital?: Better ("I am doing fine; just a little sore, but pain is manageable without me having to take the narcotic pain medicine; I am able to do everuthing I need to to care for myself, but my wofe is here if I were to need help with anything") Any questions or concerns?: No  Items Reviewed: Did you receive and understand the discharge instructions provided?: Yes (thoroughly reviewed with patient who verbalizes good understanding of same) Medications obtained,verified, and reconciled?: Yes (Medications Reviewed) (Full medication reconciliation/ review completed; no concerns or discrepancies identified; confirmed patient obtained/ is taking all newly Rx'd medications as instructed; self-manages medications and denies questions/ concerns around medications today) Any new allergies since your discharge?: No Dietary orders reviewed?: Yes Type of Diet Ordered:: "Pretty much regular" Do you have support at home?: Yes People in Home: spouse Name of Support/Comfort Primary Source: Reports independent in self-care activities; spouse assists as/ if needed/ indicated  Medications Reviewed Today: Medications Reviewed Today     Reviewed by Michaela Corner, RN (Registered Nurse) on 05/03/22 at 1425  Med List Status: <None>   Medication Order Taking? Sig Documenting Provider Last Dose Status Informant  aspirin EC 81 MG tablet [id number]  Take 1 tablet (81 mg total) by mouth daily.  Iran Sizer, PA-C  Active   HYDROcodone-acetaminophen (NORCO/VICODIN) 5-325 MG tablet [id number]  Take 1 tablet by mouth every 4 (four) hours as needed for moderate pain. Iran Sizer, PA-C  Active   Multiple Vitamin (MULTIVITAMIN WITH MINERALS) TABS tablet [id number]  Take 1 tablet by mouth daily. [provider]  Active Self  nitroGLYCERIN (NITROSTAT) 0.4 MG SL tablet [id number]  Place 1 tablet (0.4 mg total) under the tongue every 5 (five) minutes x 3 doses as needed for chest pain. Baldo Daub, MD  Active Self           Med Note Virgie Dad   Tue May 01, 2022  8:08 AM) Never used per patient  pravastatin (PRAVACHOL) 20 MG tablet [id number]  Take 1 tablet (20 mg total) by mouth daily. Georgeanna Lea, MD  Active Self            Home Care and Equipment/Supplies: Were Home Health Services Ordered?: No Any new equipment or medical supplies ordered?: No  Functional Questionnaire: Do you need assistance with bathing/showering or dressing?: No Do you need assistance with meal preparation?: No Do you need assistance with eating?: No Do you have difficulty maintaining continence: No Do you need assistance with getting out of bed/getting out of a chair/moving?: No Do you have difficulty managing or taking your medications?: No  Follow up appointments reviewed: PCP Follow-up appointment confirmed?: NA (verified not indicated per hospital discharging provider discharge notes) Specialist Hospital Follow-up appointment confirmed?: Yes Date of Specialist follow-up appointment?: 05/10/22 Follow-Up Specialty Provider:: post-op neurosurgery provider; patient not sure if appointment is 5/9 or 5/16-- states he will call to confirm; confirms he has provider contact information; confirms he does have post-op visit already scheduled-- is not near his calendar  to confirm Do you need transportation to your follow-up appointment?: No Do you understand care options if  your condition(s) worsen?: Yes-patient verbalized understanding  SDOH Interventions Today    Flowsheet Row Most Recent Value  SDOH Interventions   Food Insecurity Interventions Intervention Not Indicated  Transportation Interventions Intervention Not Indicated  [normally drives self,  wife assists as/ if needed]      TOC Interventions Today    Flowsheet Row Most Recent Value  TOC Interventions   TOC Interventions Discussed/Reviewed TOC Interventions Discussed  [Patient declines need for ongoing/ further care coordination outreach,  no care coordination needs identified at time of TOC call today]      Interventions Today    Flowsheet Row Most Recent Value  Chronic Disease   Chronic disease during today's visit Other  [recent neurosurgery for trigeminal neuralgia]  General Interventions   General Interventions Discussed/Reviewed General Interventions Discussed, Doctor Visits  Doctor Visits Discussed/Reviewed Doctor Visits Discussed, Specialist  PCP/Specialist Visits Compliance with follow-up visit  Education Interventions   Education Provided Provided Education  Provided Verbal Education On When to see the doctor, Other  [driving restrictions if he needs to take pain medication post-surgery,  need to contact neurosurgeon to confirm exact date of post-op office visit]  Nutrition Interventions   Nutrition Discussed/Reviewed Nutrition Discussed  Pharmacy Interventions   Pharmacy Dicussed/Reviewed Pharmacy Topics Discussed  [Full medication review with updating medication list in EHR per patient report]      Caryl Pina, RN, BSN, CCRN Alumnus RN CM Care Coordination/ Transition of Care- [facility] Care Management [phone]: direct office

## 2022-05-05 ENCOUNTER — Other Ambulatory Visit: Payer: Self-pay | Admitting: Neurology

## 2022-05-08 DIAGNOSIS — D225 Melanocytic nevi of trunk: Secondary | ICD-10-CM | POA: Diagnosis not present

## 2022-05-08 DIAGNOSIS — D0421 Carcinoma in situ of skin of right ear and external auricular canal: Secondary | ICD-10-CM | POA: Diagnosis not present

## 2022-05-08 DIAGNOSIS — L814 Other melanin hyperpigmentation: Secondary | ICD-10-CM | POA: Diagnosis not present

## 2022-05-08 DIAGNOSIS — Z85828 Personal history of other malignant neoplasm of skin: Secondary | ICD-10-CM | POA: Diagnosis not present

## 2022-05-08 DIAGNOSIS — L821 Other seborrheic keratosis: Secondary | ICD-10-CM | POA: Diagnosis not present

## 2022-05-14 ENCOUNTER — Encounter (HOSPITAL_BASED_OUTPATIENT_CLINIC_OR_DEPARTMENT_OTHER): Payer: Self-pay | Admitting: Urology

## 2022-05-14 ENCOUNTER — Emergency Department (HOSPITAL_BASED_OUTPATIENT_CLINIC_OR_DEPARTMENT_OTHER)
Admission: EM | Admit: 2022-05-14 | Discharge: 2022-05-14 | Disposition: A | Discharge status: Home / Self Care | Payer: BC Managed Care – PPO | Attending: Emergency Medicine | Admitting: Emergency Medicine

## 2022-05-14 ENCOUNTER — Other Ambulatory Visit: Payer: Self-pay

## 2022-05-14 DIAGNOSIS — Z20822 Contact with and (suspected) exposure to covid-19: Secondary | ICD-10-CM | POA: Insufficient documentation

## 2022-05-14 DIAGNOSIS — R0981 Nasal congestion: Secondary | ICD-10-CM | POA: Diagnosis not present

## 2022-05-14 DIAGNOSIS — J0100 Acute maxillary sinusitis, unspecified: Secondary | ICD-10-CM | POA: Diagnosis not present

## 2022-05-14 LAB — RESP PANEL BY RT-PCR (RSV, FLU A&B, COVID)  RVPGX2
Influenza A by PCR: NEGATIVE
Influenza B by PCR: NEGATIVE
Resp Syncytial Virus by PCR: NEGATIVE
SARS Coronavirus 2 by RT PCR: NEGATIVE

## 2022-05-14 MED ORDER — AMOXICILLIN-POT CLAVULANATE 875-125 MG PO TABS: 1.0000 | ORAL_TABLET | Freq: Two times a day (BID) | ORAL | 0 refills | Status: DC

## 2022-05-14 NOTE — Discharge Instructions (Addendum)
It was a pleasure taking care of you!   Your COVID, Flu, RSV swab was negative today. You will be sent a prescription for Augmentin, take as directed. You may take over the counter 600 mg Ibuprofen every 6 hours and alternate with 500 mg Tylenol every 6 hours as needed for pain for no more than 7 days. Ensure to maintain fluid intake with tea, soup, broth, Pedialyte, Gatorade, water.  You may follow-up with your primary care provider as needed.  Return to the Emergency Department if you are experiencing trouble breathing, worsening or increasing chest pain, decreased fluid intake or worsening symptoms.

## 2022-05-14 NOTE — ED Provider Notes (Signed)
[place] EMERGENCY DEPARTMENT AT [facility] Provider Note   CSN: [id number] Arrival date & time: 05/14/22  9562     History  Chief Complaint  Patient presents with   Nasal Congestion    Jacob Ray is a 72 y.o. male who presents emergency department with concerns for nasal congestion x 1 week.  Denies sick contacts.  Has associated chills, dry cough.  No meds tried at home.  Denies chest pain, shortness of breath, rhinorrhea, sore throat.  Had trigeminal neuralgia surgery 3 weeks ago.  Denies anticoagulant use.  Denies history of PE/DVT, hormone replacement therapy.  The history is provided by the patient. No language interpreter was used.       Home Medications Prior to Admission medications   Medication Sig Start Date End Date Taking? Authorizing Provider  aspirin EC 81 MG tablet Take 1 tablet (81 mg total) by mouth daily. 05/11/22   Iran Sizer, PA-C  HYDROcodone-acetaminophen (NORCO/VICODIN) 5-325 MG tablet Take 1 tablet by mouth every 4 (four) hours as needed for moderate pain. 05/02/22   Iran Sizer, PA-C  Multiple Vitamin (MULTIVITAMIN WITH MINERALS) TABS tablet Take 1 tablet by mouth daily.    [provider]  nitroGLYCERIN (NITROSTAT) 0.4 MG SL tablet Place 1 tablet (0.4 mg total) under the tongue every 5 (five) minutes x 3 doses as needed for chest pain. 02/28/18   Baldo Daub, MD  pravastatin (PRAVACHOL) 20 MG tablet Take 1 tablet (20 mg total) by mouth daily. 12/14/21   Georgeanna Lea, MD      Allergies    Patient has no known allergies.    Review of Systems   Review of Systems  All other systems reviewed and are negative.   Physical Exam Updated Vital Signs BP 109/81 (BP Location: Left Arm)   Pulse 84   Temp 98.2 F (36.8 C) (Oral)   Resp 18   Ht 6\' 2"  (1.88 m)   Wt 102.1 kg   SpO2 97%   BMI 28.89 kg/m  Physical Exam Vitals and nursing note reviewed.  Constitutional:      General: He is not in  acute distress.    Appearance: He is not diaphoretic.  HENT:     Head: Normocephalic and atraumatic.     Comments: TTP noted to bilateral maxillary sinuses. No frontal sinus TTP.    Mouth/Throat:     Pharynx: No oropharyngeal exudate.  Eyes:     General: No scleral icterus.    Conjunctiva/sclera: Conjunctivae normal.  Cardiovascular:     Rate and Rhythm: Normal rate and regular rhythm.     Pulses: Normal pulses.     Heart sounds: Normal heart sounds.  Pulmonary:     Effort: Pulmonary effort is normal. No respiratory distress.     Breath sounds: Normal breath sounds. No wheezing.     Comments: Able to speak in clear complete sentences. Abdominal:     General: Bowel sounds are normal.     Palpations: Abdomen is soft. There is no mass.     Tenderness: There is no abdominal tenderness. There is no guarding or rebound.  Musculoskeletal:        General: Normal range of motion.     Cervical back: Normal range of motion and neck supple.  Skin:    General: Skin is warm and dry.  Neurological:     Mental Status: He is alert.  Psychiatric:        Behavior: Behavior  normal.     ED Results / Procedures / Treatments   Labs (all labs ordered are listed, but only abnormal results are displayed) Labs Reviewed  RESP PANEL BY RT-PCR (RSV, FLU A&B, COVID)  [id number]    EKG None  Radiology No results found.  Procedures Procedures    Medications Ordered in ED Medications - No data to display  ED Course/ Medical Decision Making/ A&P Clinical Course as of 05/14/22 1102  Mon May 14, 2022  1610 Offered chest x-ray to patient to rule out pneumonia as the symptoms have been ongoing for a week.  Patient declines chest x-ray at this time and notes that he just wants to be checked for COVID, flu, RSV before he leaves to go out of town.  His postop appointment with his specialist is on next Thursday, 05/17/2022. [SB]  1053 Re-evaluated and discussed with patient swab findings. Discussed with  patient that in light of him recently having surgery that we could rule out PE at this time due to his concerns for cough. Shared decision making with patient who at this time declines further testing. Pt overall well appearing without CP, SOB, leg swelling at this time. Discussed discharge treatment plan. Pt agreeable at this time. Pt appears safe for discharge. [SB]    Clinical Course User Index [SB] Cuong Moorman A, PA-C                             Medical Decision Making Risk Prescription drug management.   Pt presents with concerns for nasal congestion onset 1 week. Vital signs, pt afebrile. On exam, pt with TTP noted to bilateral maxillary sinuses. No frontal sinus TTP. No acute cardiovascular, respiratory, abdominal exam findings. Differential diagnosis includes COVID, Flu, RSV, viral URI with cough.    Additional history obtained:  External records from outside source obtained and reviewed including: Pt with surgery for right trigeminal neuralgia on 05/01/22.   Labs:  I ordered, and personally interpreted labs.  The pertinent results include:   Negative COVID, Flu, RSV at this time   Disposition: Presentation suspicious for acute maxillary sinusitis. Doubt COVID, Flu, RSV. Likely viral URI with cough as well. After consideration of the diagnostic results and the patients response to treatment, I feel that the patient would benefit from Discharge home. Augmentin Rx sent. Supportive care measures and strict return precautions discussed with patient at bedside. Pt acknowledges and verbalizes understanding. Pt appears safe for discharge. Follow up as indicated in discharge paperwork.    This chart was dictated using voice recognition software, Dragon. Despite the best efforts of this provider to proofread and correct errors, errors may still occur which can change documentation meaning.  Final Clinical Impression(s) / ED Diagnoses Final diagnoses:  Acute non-recurrent maxillary  sinusitis    Rx / DC Orders ED Discharge Orders          Ordered    amoxicillin-clavulanate (AUGMENTIN) 875-125 MG tablet  Every 12 hours        05/14/22 1058              Dickson Kostelnik A, PA-C 05/14/22 1446    Melene Plan, DO 05/14/22 1447

## 2022-05-14 NOTE — ED Notes (Signed)
Reviewed discharge instructions and follow up with pt. Pt states understanding 

## 2022-05-14 NOTE — ED Triage Notes (Signed)
Pt states congestion and chills that started approx 1 week ago  Wanted to get checked prior to going out of town  Mild dry cough noted

## 2022-05-22 ENCOUNTER — Ambulatory Visit (INDEPENDENT_AMBULATORY_CARE_PROVIDER_SITE_OTHER): Payer: BC Managed Care – PPO | Admitting: Internal Medicine

## 2022-05-22 ENCOUNTER — Encounter: Payer: Self-pay | Admitting: Internal Medicine

## 2022-05-22 VITALS — BP 132/60 | HR 90 | Temp 98.5°F | Resp 18 | Ht 74.0 in | Wt 223.2 lb

## 2022-05-22 DIAGNOSIS — E785 Hyperlipidemia, unspecified: Secondary | ICD-10-CM | POA: Diagnosis not present

## 2022-05-22 DIAGNOSIS — Z0000 Encounter for general adult medical examination without abnormal findings: Secondary | ICD-10-CM

## 2022-05-22 DIAGNOSIS — E559 Vitamin D deficiency, unspecified: Secondary | ICD-10-CM | POA: Diagnosis not present

## 2022-05-22 DIAGNOSIS — R7989 Other specified abnormal findings of blood chemistry: Secondary | ICD-10-CM

## 2022-05-22 DIAGNOSIS — N400 Enlarged prostate without lower urinary tract symptoms: Secondary | ICD-10-CM

## 2022-05-22 LAB — BASIC METABOLIC PANEL
BUN: 19 mg/dL (ref 6–23)
CO2: 27 mEq/L (ref 19–32)
Calcium: 9.2 mg/dL (ref 8.4–10.5)
Chloride: 104 mEq/L (ref 96–112)
Creatinine, Ser: 1.34 mg/dL (ref 0.40–1.50)
GFR: 53.79 mL/min — ABNORMAL LOW (ref >60.00)
Glucose, Bld: 73 mg/dL (ref 70–99)
Potassium: 4.1 mEq/L (ref 3.5–5.1)
Sodium: 140 mEq/L (ref 135–145)

## 2022-05-22 LAB — PSA: PSA: 0.27 ng/mL (ref 0.10–4.00)

## 2022-05-22 LAB — TSH: TSH: 1.9 u[IU]/mL (ref 0.35–5.50)

## 2022-05-22 NOTE — Progress Notes (Unsigned)
Subjective:    Patient ID: Jacob Ray, male    DOB: Aug 18, 1950, 72 y.o.   MRN: [id number]  DOS:  05/22/2022 Type of visit - description: cpx  Here for CPX. Since the last visit saw cardiology, neurology.  Notes reviewed. Had surgery for trigeminal neuralgia.  Doing well Urine flow is somewhat slow but denies dysuria or gross hematuria.   Wt Readings from Last 3 Encounters:  05/22/22 223 lb 4 oz (101.3 kg)  05/14/22 225 lb (102.1 kg)  05/01/22 235 lb (106.6 kg)     Review of Systems  Other than above, a 14 point review of systems is negative    Past Medical History:  Diagnosis Date   Barrett's esophagus    EGD on 05/04/2013, next due in 5/18, [facility]   BCC (basal cell carcinoma), arm 03/2013   Right forearm   Blepharitis, right eye 2013   BPH (benign prostatic hyperplasia)    Coronary artery disease    Non-obstructive per Dr. Dulce Sellar   GERD (gastroesophageal reflux disease)    Heart murmur    History of Helicobacter pylori infection 05/2013   History of kidney stones    Mitral regurgitation 2009   mild per ECHO   Pneumonia    Shingles 2013   Varicose veins    bilateral    Past Surgical History:  Procedure Laterality Date   COLONOSCOPY     CRANIECTOMY Right 05/01/2022   Procedure: Right Microvascular decompression of the fifth cranial nerve;  Surgeon: Jadene Pierini, MD;  Location: [facility] OR;  Service: Neurosurgery;  Laterality: Right;  RM 21 TO FOLLOW   LEFT HEART CATH AND CORONARY ANGIOGRAPHY N/A 07/03/2018   Procedure: LEFT HEART CATH AND CORONARY ANGIOGRAPHY;  Surgeon: [place], [name], MD;  Location: [facility] INVASIVE CV LAB;  Service: Cardiovascular;  Laterality: N/A;   SPIROMETRY  03/18/2013   [facility]   TONSILLECTOMY     UPPER GASTROINTESTINAL ENDOSCOPY     WISDOM TOOTH EXTRACTION     Social History   Socioeconomic History   Marital status: Married    Spouse name: Not on file   Number of children: 1   Years of education: Not on file    Highest education level: Not on file  Occupational History   Occupation: Airline pilot , works full time   Tobacco Use   Smoking status: Never    Passive exposure: Never   Smokeless tobacco: Current    Types: Chew   Tobacco comments:    Rarely, sees dentist regularly, not ready to quit   Vaping Use   Vaping Use: Never used  Substance and Sexual Activity   Alcohol use: Yes    Alcohol/week: 8.0 standard drinks of alcohol    Types: 8 Cans of beer per week    Comment: 2 times week    Drug use: No   Sexual activity: Not on file  Other Topics Concern   Not on file  Social History Narrative   Household-- pt and wife   Adult child    Caffeine: 1 cup/day    Social Determinants of Health   Financial Resource Strain: Not on file  Food Insecurity: No Food Insecurity (05/03/2022)   Hunger Vital Sign    Worried About Running Out of Food in the Last Year: Never true    Ran Out of Food in the Last Year: Never true  Transportation Needs: No Transportation Needs (05/03/2022)   PRAPARE - Transportation    Lack of  Transportation (Medical): No    Lack of Transportation (Non-Medical): No  Physical Activity: Not on file  Stress: Not on file  Social Connections: Not on file  Intimate Partner Violence: Not on file    Current Outpatient Medications  Medication Instructions   amoxicillin-clavulanate (AUGMENTIN) 875-125 MG tablet 1 tablet, Oral, Every 12 hours   aspirin EC 81 mg, Oral, Daily   Multiple Vitamin (MULTIVITAMIN WITH MINERALS) TABS tablet 1 tablet, Oral, Daily   nitroGLYCERIN (NITROSTAT) 0.4 mg, Sublingual, Every 5 min x3 PRN   pravastatin (PRAVACHOL) 20 mg, Oral, Daily       Objective:   Physical Exam BP 132/60   Pulse 90   Temp 98.5 F (36.9 C) (Oral)   Resp 18   Ht 6\' 2"  (1.88 m)   Wt 223 lb 4 oz (101.3 kg)   SpO2 95%   BMI 28.66 kg/m  General: Well developed, NAD, BMI noted Neck: No  thyromegaly  HEENT:  Normocephalic . Face symmetric, atraumatic Lungs:  CTA  B Normal respiratory effort, no intercostal retractions, no accessory muscle use. Heart: RRR,  no murmur.  Abdomen:  Not distended, soft, non-tender. No rebound or rigidity.   Lower extremities: no pretibial edema bilaterally  Skin: Exposed areas without rash. Not pale. Not jaundice Neurologic:  alert & oriented X3.  Speech normal, gait appropriate for age and unassisted Strength symmetric and appropriate for age.  Psych: Cognition and judgment appear intact.  Cooperative with normal attention span and concentration.  Behavior appropriate. No anxious or depressed appearing.     Assessment     Assessment  GERD --   Dr Noe Gens , s/p EGD x 2, no major findings per pt  Esophageal  stricture, dilatation 07/12/2010  Barrett's esophagus, last EGD 05/04/2013: Pathology: Barrett's esophagitis, eosinophilic esophagitis, eosinophilic gastritis, gastric polyp, H. pylori infection. H. pylori testing 11-2015: Negative EGD 2018: + Bx for Barrett's EGD: 11-2019  CAD: had CP, cath 07/2018: Mild single-vessel disease.  Medical therapy H/o  BPH, has seen urology. Skin cancer: BCC per biopsy 03/24/2013  Vitamin D deficiency H/o kidney stone ~ 09-2015 per pt report Varicose veins, right ankle  PLAN: Here for CPX, see separate recommendation. GERD: On no medications, symptoms controlled, denies dysphagia CAD: LOV cardiology December 2023, doing well, denies chest pain or difficulty breathing High cholesterol: Last LDL 64, no change per cardiology recommendation. Trigeminal neuralgia: Status post decompression surgery 05/01/2022, recuperating well.. OSA?  Referred for sleep study by neurology, patient states he does not feel that is necessary. Vitamin D deficiency: Reports takes OTCs.  Checking labs. RTC 6 months

## 2022-05-22 NOTE — Patient Instructions (Addendum)
Vaccines I recommend: Covid booster RSV vaccine Flu shot every fall    GO TO THE LAB : Get the blood work     GO TO THE FRONT DESK, PLEASE SCHEDULE YOUR APPOINTMENTS Come back for   checkup in 6 months    Please bring [place] a copy of your Healthcare Power of Attorney for your chart.

## 2022-05-23 ENCOUNTER — Encounter: Payer: Self-pay | Admitting: Internal Medicine

## 2022-05-23 LAB — VITAMIN D 25 HYDROXY (VIT D DEFICIENCY, FRACTURES): VITD: 32.85 ng/mL (ref 30.00–100.00)

## 2022-05-23 NOTE — Assessment & Plan Note (Signed)
Here for CPX, see separate recommendation. GERD: On no medications, symptoms controlled, denies dysphagia CAD: LOV cardiology December 2023, doing well, denies chest pain or difficulty breathing High cholesterol: Last LDL 64, no change per cardiology recommendation. Trigeminal neuralgia: Status post decompression surgery 05/01/2022, recuperating well.. OSA?  Referred for sleep study by neurology, patient states he does not feel that is necessary. Vitamin D deficiency: Reports takes OTCs.  Checking labs. RTC 6 months

## 2022-05-23 NOTE — Assessment & Plan Note (Signed)
-   Td 2016 - PNM 23: 2020; PNM: 20 : 2023;  s/p Shingrix:  -Vaccines I recommend: RSV, COVID booster if not done by 09-2021, flu shot every fall. -CCS: Dr Noe Gens: Colonoscopy   05-2010, 05/18/2013, inadequate prep, no polyp, no BX, repeat in 5 years. Colonoscopy 11-2016 at [facility]: No polyps, internal hemorrhoids, 10 years -Prostate ca screening: Saw urology 06/2021, discussed possible prostatectomy for BPH. Currently w/  mild symptoms, rec  to reach out to urology for further advice. Check PSA  -Very rarely chewed tobacco.  Counseled. -Labs reviewed, will check a BMP TSH PSA vitamin D -Healthcare POA: Recommend to bring a copy for his file.

## 2022-06-06 DIAGNOSIS — C44212 Basal cell carcinoma of skin of right ear and external auricular canal: Secondary | ICD-10-CM | POA: Diagnosis not present

## 2022-06-20 DIAGNOSIS — L570 Actinic keratosis: Secondary | ICD-10-CM | POA: Diagnosis not present

## 2022-08-01 DIAGNOSIS — G500 Trigeminal neuralgia: Secondary | ICD-10-CM | POA: Diagnosis not present

## 2022-11-07 ENCOUNTER — Encounter: Payer: Self-pay | Admitting: Gastroenterology

## 2022-11-23 ENCOUNTER — Encounter: Payer: Self-pay | Admitting: Internal Medicine

## 2022-11-23 ENCOUNTER — Ambulatory Visit (INDEPENDENT_AMBULATORY_CARE_PROVIDER_SITE_OTHER): Payer: BC Managed Care – PPO | Admitting: Internal Medicine

## 2022-11-23 VITALS — BP 118/72 | HR 65 | Temp 97.5°F | Resp 18 | Ht 74.0 in | Wt 226.4 lb

## 2022-11-23 DIAGNOSIS — K22719 Barrett's esophagus with dysplasia, unspecified: Secondary | ICD-10-CM

## 2022-11-23 DIAGNOSIS — Z23 Encounter for immunization: Secondary | ICD-10-CM

## 2022-11-23 DIAGNOSIS — E785 Hyperlipidemia, unspecified: Secondary | ICD-10-CM

## 2022-11-23 DIAGNOSIS — I2510 Atherosclerotic heart disease of native coronary artery without angina pectoris: Secondary | ICD-10-CM

## 2022-11-23 LAB — ALT: ALT: 12 U/L (ref 0–53)

## 2022-11-23 LAB — LIPID PANEL
Cholesterol: 147 mg/dL (ref 0–200)
HDL: 50 mg/dL (ref >39.00)
LDL Cholesterol: 75 mg/dL (ref 0–99)
NonHDL: 96.52
Total CHOL/HDL Ratio: 3
Triglycerides: 108 mg/dL (ref 0.0–149.0)
VLDL: 21.6 mg/dL (ref 0.0–40.0)

## 2022-11-23 LAB — AST: AST: 13 U/L (ref 0–37)

## 2022-11-23 NOTE — Assessment & Plan Note (Signed)
Barrett's esophagus: Last EGD 2021, small segment of Barrett noted, next in 3 to 5 years.  Patient is   essentially asymptomatic, not inclined to take PPIs. CAD: Mild disease, ASX, on statins.  Check FLP. Hyperlipidemia: See above Preventive care: Flu shot today, recommend COVID booster, he plans to proceed. RTC 05/2023 CPX

## 2022-11-23 NOTE — Progress Notes (Signed)
Subjective:    Patient ID: Jacob Ray, male    DOB: [birth year], 72 y.o.   MRN: [id number]  DOS:  11/23/2022 Type of visit - description: f/u  Here for follow-up.  Doing well and has no major concerns. History of Barrett's, denies nausea, vomiting, dysphagia. Hardly ever has heartburn.  Review of Systems See above   Past Medical History:  Diagnosis Date   Barrett's esophagus    EGD on 05/04/2013, next due in 5/18, [facility]   BCC (basal cell carcinoma), arm 03/2013   Right forearm   Blepharitis, right eye 2013   BPH (benign prostatic hyperplasia)    Coronary artery disease    Non-obstructive per Dr. Dulce Sellar   GERD (gastroesophageal reflux disease)    Heart murmur    History of Helicobacter pylori infection 05/2013   History of kidney stones    Mitral regurgitation 2009   mild per ECHO   Pneumonia    Shingles 2013   Varicose veins    bilateral    Past Surgical History:  Procedure Laterality Date   COLONOSCOPY     CRANIECTOMY Right 05/01/2022   Procedure: Right Microvascular decompression of the fifth cranial nerve;  Surgeon: Jadene Pierini, MD;  Location: [facility] OR;  Service: Neurosurgery;  Laterality: Right;  RM 21 TO FOLLOW   LEFT HEART CATH AND CORONARY ANGIOGRAPHY N/A 07/03/2018   Procedure: LEFT HEART CATH AND CORONARY ANGIOGRAPHY;  Surgeon: [place], [name], MD;  Location: [facility] INVASIVE CV LAB;  Service: Cardiovascular;  Laterality: N/A;   SPIROMETRY  03/18/2013   [facility]   TONSILLECTOMY     UPPER GASTROINTESTINAL ENDOSCOPY     WISDOM TOOTH EXTRACTION      Current Outpatient Medications  Medication Instructions   amoxicillin-clavulanate (AUGMENTIN) 875-125 MG tablet 1 tablet, Oral, Every 12 hours   aspirin EC 81 mg, Oral, Daily   Cholecalciferol (VITAMIN D) 50 MCG (2000 UT) CAPS Oral   Multiple Vitamin (MULTIVITAMIN WITH MINERALS) TABS tablet 1 tablet, Oral, Daily   nitroGLYCERIN (NITROSTAT) 0.4 mg, Sublingual, Every 5 min x3 PRN    pravastatin (PRAVACHOL) 20 mg, Oral, Daily  General:   Well developed, NAD, BMI noted. HEENT:  Normocephalic . Face symmetric, atraumatic Lungs:  CTA B Normal respiratory effort, no intercostal retractions, no accessory muscle use. Heart: RRR,  no murmur.  Lower extremities: no pretibial edema bilaterally  Skin: Not pale. Not jaundice Neurologic:  alert & oriented X3.  Speech normal, gait appropriate for age and unassisted Psych--  Cognition and judgment appear intact.  Cooperative with normal attention span and concentration.  Behavior appropriate. No anxious or depressed appearing.       Objective:   Physical Exam BP 118/72   Pulse 65   Temp (!) 97.5 F (36.4 C) (Oral)   Resp 18   Ht 6\' 2"  (1.88 m)   Wt 226 lb 6 oz (102.7 kg)   SpO2 98%   BMI 29.06 kg/m      Assessment    Assessment  GERD --   Dr Noe Gens , s/p EGD x 2, no major findings per pt  Esophageal  stricture, dilatation 07/12/2010  Barrett's esophagus, last EGD 05/04/2013: Pathology: Barrett's esophagitis, eosinophilic esophagitis, eosinophilic gastritis, gastric polyp, H. pylori infection. H. pylori testing 11-2015: Negative EGD 2018: + Bx for Barrett's EGD: 11-2019 : Barrett's , next 3 to 5 years  CAD: had CP, cath 07/2018: Mild single-vessel disease.  Medical therapy H/o  BPH, has seen urology. Skin  cancer: BCC per biopsy 03/24/2013  Vitamin D deficiency H/o kidney stone ~ 09-2015 per pt report Varicose veins, right ankle  PLAN: Barrett's esophagus: Last EGD 2021, small segment of Barrett noted, next in 3 to 5 years.  Patient is   essentially asymptomatic, not inclined to take PPIs. CAD: Mild disease, ASX, on statins.  Check FLP. Hyperlipidemia: See above Preventive care: Flu shot today, recommend COVID booster, he plans to proceed. RTC 05/2023 CPX

## 2022-11-23 NOTE — Patient Instructions (Addendum)
Vaccines I recommend: Covid booster    GO TO THE LAB : Get the blood work     Next visit with me  05/2023 for a physical exam  Please schedule it at the front desk

## 2022-12-13 ENCOUNTER — Other Ambulatory Visit: Payer: Self-pay | Admitting: Cardiology

## 2023-01-12 DIAGNOSIS — Z6829 Body mass index (BMI) 29.0-29.9, adult: Secondary | ICD-10-CM | POA: Diagnosis not present

## 2023-01-12 DIAGNOSIS — H6123 Impacted cerumen, bilateral: Secondary | ICD-10-CM | POA: Diagnosis not present

## 2023-01-18 ENCOUNTER — Encounter: Payer: Self-pay | Admitting: Cardiology

## 2023-01-18 ENCOUNTER — Ambulatory Visit: Payer: Medicare Other | Attending: Cardiology | Admitting: Cardiology

## 2023-01-18 VITALS — BP 106/70 | HR 65 | Ht 74.0 in | Wt 225.0 lb

## 2023-01-18 DIAGNOSIS — I2510 Atherosclerotic heart disease of native coronary artery without angina pectoris: Secondary | ICD-10-CM | POA: Diagnosis not present

## 2023-01-18 DIAGNOSIS — E785 Hyperlipidemia, unspecified: Secondary | ICD-10-CM | POA: Diagnosis not present

## 2023-01-18 DIAGNOSIS — R011 Cardiac murmur, unspecified: Secondary | ICD-10-CM | POA: Diagnosis not present

## 2023-01-18 DIAGNOSIS — I340 Nonrheumatic mitral (valve) insufficiency: Secondary | ICD-10-CM | POA: Diagnosis not present

## 2023-01-18 MED ORDER — PRAVASTATIN SODIUM 40 MG PO TABS: 40.0000 mg | ORAL_TABLET | Freq: Every evening | ORAL | 3 refills | Status: AC

## 2023-01-18 NOTE — Patient Instructions (Addendum)
Medication Instructions:    Increase your Pravastatin  to 40 mg once a day.  *If you need a refill on your cardiac medications before your next appointment, please call your pharmacy*   Lab Work:  Your physician recommends that you return for lab work in: 6 weeks  You need to have labs done when you are fasting.  You can come Monday through Friday 8:30 am to 11:30 AM   and 1:00 to 4:00 pm.  You do not need to make an appointment as the order has already been placed. The labs you are going to have done are Lipid panel, AST, ALT.   If you have labs (blood work) drawn today and your tests are completely normal, you will receive your results only by: MyChart Message (if you have MyChart) OR A paper copy in the mail If you have any lab test that is abnormal or we need to change your treatment, we will call you to review the results.   Testing/Procedures: Echocardiogram An echocardiogram is a test that uses sound waves (ultrasound) to produce images of the heart. Images from an echocardiogram can provide important information about: Heart size and shape. The size and thickness and movement of your heart's walls. Heart muscle function and strength. Heart valve function or if you have stenosis. Stenosis is when the heart valves are too narrow. If blood is flowing backward through the heart valves (regurgitation). A tumor or infectious growth around the heart valves. Areas of heart muscle that are not working well because of poor blood flow or injury from a heart attack. Aneurysm detection. An aneurysm is a weak or damaged part of an artery wall. The wall bulges out from the normal force of blood pumping through the body. Tell a health care provider about: Any allergies you have. All medicines you are taking, including vitamins, herbs, eye drops, creams, and over-the-counter medicines. Any blood disorders you have. Any surgeries you have had. Any medical conditions you have. Whether you  are pregnant or may be pregnant. What are the risks? Generally, this is a safe test. However, problems may occur, including an allergic reaction to dye (contrast) that may be used during the test. What happens before the test? No specific preparation is needed. You may eat and drink normally. What happens during the test?  You will take off your clothes from the waist up and put on a hospital gown. Electrodes or electrocardiogram (ECG)patches may be placed on your chest. The electrodes or patches are then connected to a device that monitors your heart rate and rhythm. You will lie down on a table for an ultrasound exam. A gel will be applied to your chest to help sound waves pass through your skin. A handheld device, called a transducer, will be pressed against your chest and moved over your heart. The transducer produces sound waves that travel to your heart and bounce back (or "echo" back) to the transducer. These sound waves will be captured in real-time and changed into images of your heart that can be viewed on a video monitor. The images will be recorded on a computer and reviewed by your health care provider. You may be asked to change positions or hold your breath for a short time. This makes it easier to get different views or better views of your heart. In some cases, you may receive contrast through an IV in one of your veins. This can improve the quality of the pictures from your heart. The procedure  may vary among health care providers and hospitals. What can I expect after the test? You may return to your normal, everyday life, including diet, activities, and medicines, unless your health care provider tells you not to do that. Follow these instructions at home: It is up to you to get the results of your test. Ask your health care provider, or the department that is doing the test, when your results will be ready. Keep all follow-up visits. This is important. Summary An  echocardiogram is a test that uses sound waves (ultrasound) to produce images of the heart. Images from an echocardiogram can provide important information about the size and shape of your heart, heart muscle function, heart valve function, and other possible heart problems. You do not need to do anything to prepare before this test. You may eat and drink normally. After the echocardiogram is completed, you may return to your normal, everyday life, unless your health care provider tells you not to do that. This information is not intended to replace advice given to you by your health care provider. Make sure you discuss any questions you have with your health care provider. Document Revised: 08/31/2020 Document Reviewed: 08/11/2019 Elsevier Patient Education  2023 Elsevier Inc.        Follow-Up: At [facility], you and your health needs are our priority.  As part of our continuing mission to provide you with exceptional heart care, we have created designated Provider Care Teams.  These Care Teams include your primary Cardiologist (physician) and Advanced Practice Providers (APPs -  Physician Assistants and Nurse Practitioners) who all work together to provide you with the care you need, when you need it.  We recommend signing up for the patient portal called "MyChart".  Sign up information is provided on this After Visit Summary.  MyChart is used to connect with patients for Virtual Visits (Telemedicine).  Patients are able to view lab/test results, encounter notes, upcoming appointments, etc.  Non-urgent messages can be sent to your provider as well.   To learn more about what you can do with MyChart, go to [email or web address].    Your next appointment:   1 year follow up

## 2023-01-18 NOTE — Progress Notes (Unsigned)
Cardiology Office Note:    Date:  01/18/2023   ID:  Jacob [facility], DOB 24-Feb-1950, MRN [id number]  PCP:  Jacob Plump, MD  Cardiologist:  Jacob Balsam, MD    Referring MD: Jacob Plump, MD   Chief Complaint  Patient presents with   Establish Care    History of Present Illness:    Jacob Ray is a 73 y.o. male past medical history significant for coronary artery disease in July 2020 he got cardiac catheterization done which showed 40% proximal LAD patent to distal LAD 30%, ejection fraction was 5055%, overall it was nonobstructive he also got mitral regurgitation which is mild, dyslipidemia. Comes today to months for follow-up overall doing well.  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  He does about 12,000 steps every day.  No chest pain tightness squeezing pressure burning chest  Past Medical History:  Diagnosis Date   Barrett's esophagus    EGD on 05/04/2013, next due in 5/18, [facility]   BCC (basal cell carcinoma), arm 03/2013   Right forearm   Blepharitis, right eye 2013   BPH (benign prostatic hyperplasia)    Coronary artery disease    Non-obstructive per Dr. Dulce Ray   GERD (gastroesophageal reflux disease)    Heart murmur    History of Helicobacter pylori infection 05/2013   History of kidney stones    Mitral regurgitation 2009   mild per ECHO   Pneumonia    Shingles 2013   Varicose veins    bilateral    Past Surgical History:  Procedure Laterality Date   COLONOSCOPY     CRANIECTOMY Right 05/01/2022   Procedure: Right Microvascular decompression of the fifth cranial nerve;  Surgeon: Jacob Pierini, MD;  Location: [facility];  Service: Neurosurgery;  Laterality: Right;  RM 21 TO FOLLOW   LEFT HEART CATH AND CORONARY ANGIOGRAPHY N/A 07/03/2018   Procedure: LEFT HEART CATH AND CORONARY ANGIOGRAPHY;  Surgeon: Swaziland, [name], MD;  Location: [facility];  Service: Cardiovascular;   Laterality: N/A;   SPIROMETRY  03/18/2013   [facility]   TONSILLECTOMY     TRIGEMINAL NERVE DECOMPRESSION     UPPER GASTROINTESTINAL ENDOSCOPY     WISDOM TOOTH EXTRACTION      Current Medications: Current Meds  Medication Sig   aspirin EC 81 MG tablet Take 1 tablet (81 mg total) by mouth daily.   Cholecalciferol (VITAMIN D) 50 MCG (2000 UT) CAPS Take 1 capsule by mouth daily.   nitroGLYCERIN (NITROSTAT) 0.4 MG SL tablet Place 1 tablet (0.4 mg total) under the tongue every 5 (five) minutes x 3 doses as needed for chest pain.   pravastatin (PRAVACHOL) 20 MG tablet TAKE 1 TABLET(20 MG) BY MOUTH DAILY. MAKE THEIR 01/18/2023 APPOINTMENT FOR FURTHER REFILLS (Patient taking differently: Take 20 mg by mouth daily.)   [DISCONTINUED] Multiple Vitamin (MULTIVITAMIN WITH MINERALS) TABS tablet Take 1 tablet by mouth daily.     Allergies:   Patient has no known allergies.   Social History   Socioeconomic History   Marital status: Married    Spouse name: Not on file   Number of children: 1   Years of education: Not on file   Highest education level: Not on file  Occupational History   Occupation: Airline pilot , works full time   Tobacco Use   Smoking status: Never    Passive exposure: Never   Smokeless  tobacco: Current    Types: Chew   Tobacco comments:    Rarely, sees dentist regularly, not ready to quit   Vaping Use   Vaping status: Never Used  Substance and Sexual Activity   Alcohol use: Yes    Alcohol/week: 8.0 standard drinks of alcohol    Types: 8 Cans of beer per week    Comment: 2 times week    Drug use: No   Sexual activity: Not on file  Other Topics Concern   Not on file  Social History Narrative   Household-- pt and wife   Adult child    Caffeine: 1 cup/day    Social Drivers of Corporate investment banker Strain: Low Risk  (05/30/2021)   Received from [facility], [facility]   Overall Financial Resource Strain (CARDIA)    Difficulty of Paying Living  Expenses: Not hard at all  Food Insecurity: No Food Insecurity (05/03/2022)   Hunger Vital Sign    Worried About Running Out of Food in the Last Year: Never true    Ran Out of Food in the Last Year: Never true  Transportation Needs: No Transportation Needs (05/03/2022)   PRAPARE - Administrator, Civil Service (Medical): No    Lack of Transportation (Non-Medical): No  Physical Activity: Inactive (05/30/2021)   Received from [facility], [facility]   Exercise Vital Sign    Days of Exercise per Week: 0 days    Minutes of Exercise per Session: 0 min  Stress: No Stress Concern Present (05/30/2021)   Received from [facility], [facility] - Occupational Stress Questionnaire    Feeling of Stress : Not at all  Social Connections: Unknown (06/07/2022)   Received from [facility]   Social Network    Social Network: Not on file     Family History: The patient's family history includes CAD in his brother; CAD (age of onset: 74) in his father; Diabetes in his brother; Hypertension in his father; Obesity in his brother; Prostate cancer (age of onset: 37) in his father; Valvular heart disease in his brother and father. There is no history of Colon cancer, Colon polyps, Esophageal cancer, Rectal cancer, or Stomach cancer. ROS:   Please see the history of present illness.    All 14 point review of systems negative except as described per history of present illness  EKGs/Labs/Other Studies Reviewed:    EKG Interpretation Date/Time:  Friday January 18 2023 08:14:32 EST Ventricular Rate:  64 PR Interval:  184 QRS Duration:  84 QT Interval:  370 QTC Calculation: 381 R Axis:   46  Text Interpretation: Normal sinus rhythm Possible Left atrial enlargement No previous ECGs available Confirmed by [facility] [phone]) on 01/18/2023 8:35:55 AM    Recent Labs: 05/01/2022: Hemoglobin 14.7; Platelets 190 05/22/2022: BUN 19; Creatinine, Ser  1.34; Potassium 4.1; Sodium 140; TSH 1.90 11/23/2022: ALT 12  Recent Lipid Panel    Component Value Date/Time   CHOL 147 11/23/2022 0834   CHOL 138 12/12/2021 0824   TRIG 108.0 11/23/2022 0834   HDL 50.00 11/23/2022 0834   HDL 50 12/12/2021 0824   CHOLHDL 3 11/23/2022 0834   VLDL 21.6 11/23/2022 0834   LDLCALC 75 11/23/2022 0834   LDLCALC 64 12/12/2021 0824   LDLCALC 67 11/02/2019 0842   LDLDIRECT 93.0 11/15/2014 0751    Physical Exam:    VS:  BP 106/70 (BP Location: Right Arm, Patient Position: Sitting)  Pulse 65   Ht 6\' 2"  (1.88 m)   Wt 225 lb (102.1 kg)   SpO2 94%   BMI 28.89 kg/m     Wt Readings from Last 3 Encounters:  01/18/23 225 lb (102.1 kg)  11/23/22 226 lb 6 oz (102.7 kg)  05/22/22 223 lb 4 oz (101.3 kg)     GEN:  Well nourished, well developed in no acute distress HEENT: Normal NECK: No JVD; No carotid bruits LYMPHATICS: No lymphadenopathy CARDIAC: RRR, soft holosystolic murmur grade 1/6 best heard left border of the sternum, no rubs, no gallops RESPIRATORY:  Clear to auscultation without rales, wheezing or rhonchi  ABDOMEN: Soft, non-tender, non-distended MUSCULOSKELETAL:  No edema; No deformity  SKIN: Warm and dry LOWER EXTREMITIES: no swelling NEUROLOGIC:  Alert and oriented x 3 PSYCHIATRIC:  Normal affect   ASSESSMENT:    1. Coronary artery disease involving native coronary artery of native heart without angina pectoris   2. Nonrheumatic mitral valve regurgitation   3. Dyslipidemia    PLAN:    In order of problems listed above:  Coronary disease stable asymptomatic does not have any symptoms associated strangulation of the problem.  Will continue risk factors modifications with antiplatelet therapy and statin. Dyslipidemia I did review K PN data from November showing\his LDL of 75.  Will double pravastatin to 40 mg daily, fasting lipid profile, AST LT will be repeated in 6 weeks. Elevated LP(a) only 80.  Will continue monitoring concentrated  on the fasting lipid profile and reduction of LDL. Nonrheumatic mitral valve regurgitation will do echocardiogram   Medication Adjustments/Labs and Tests Ordered: Current medicines are reviewed at length with the patient today.  Concerns regarding medicines are outlined above.  Orders Placed This Encounter  Procedures   EKG 12-Lead   Medication changes: No orders of the defined types were placed in this encounter.   Signed, Georgeanna Lea, MD, [facility] 01/18/2023 8:49 AM    [facility]

## 2023-02-14 ENCOUNTER — Ambulatory Visit (HOSPITAL_BASED_OUTPATIENT_CLINIC_OR_DEPARTMENT_OTHER)
Admission: RE | Admit: 2023-02-14 | Discharge: 2023-02-14 | Disposition: A | Discharge status: Home / Self Care | Payer: BC Managed Care – PPO | Source: Ambulatory Visit | Attending: Cardiology | Admitting: Cardiology

## 2023-02-14 DIAGNOSIS — R011 Cardiac murmur, unspecified: Secondary | ICD-10-CM | POA: Diagnosis not present

## 2023-02-16 LAB — ECHOCARDIOGRAM COMPLETE
AR max vel: 4.17 cm2
AV Area VTI: 4.17 cm2
AV Area mean vel: 4.15 cm2
AV Mean grad: 2 mm[Hg]
AV Peak grad: 2.9 mm[Hg]
Ao pk vel: 0.85 m/s
Area-P 1/2: 3.39 cm2
Calc EF: 58.8 %
Est EF: 50
S' Lateral: 3.4 cm
Single Plane A2C EF: 61.9 %
Single Plane A4C EF: 56.7 %

## 2023-02-19 ENCOUNTER — Telehealth: Payer: Self-pay

## 2023-02-19 NOTE — Telephone Encounter (Signed)
 Pt viewed Echo results on My Chart per Dr. Vanetta Shawl note. Routed to PCP.

## 2023-02-19 NOTE — Telephone Encounter (Signed)
 Left message on My Chart with Echo results per Dr. Vanetta Shawl note. Routed to PCP.

## 2023-03-11 ENCOUNTER — Ambulatory Visit
Admission: EM | Admit: 2023-03-11 | Discharge: 2023-03-11 | Disposition: A | Discharge status: Home / Self Care | Attending: Family Medicine | Admitting: Family Medicine

## 2023-03-11 DIAGNOSIS — H00015 Hordeolum externum left lower eyelid: Secondary | ICD-10-CM

## 2023-03-11 NOTE — ED Triage Notes (Signed)
 Pt c/o left lower eyelid stye x 1 week-NAD-steady gait

## 2023-03-11 NOTE — Discharge Instructions (Signed)
 Apply warm compresses 3-5 times daily as your schedule allows. Do this 5-10 minutes at a time. If you develop pain, fever, redness, swelling of the eyelid then this is a sign of infection. I am working tomorrow, Friday-Saturday. You can call me during this time if you develop these of infection.

## 2023-03-11 NOTE — ED Provider Notes (Signed)
 [facility]  Note:  This document was prepared using Conservation officer, historic buildings and may include unintentional dictation errors.  MRN: [id number] DOB: [birth year]  Subjective:   Jacob Ray is a 73 y.o. male presenting for 1 week history of persistent stye to the left lower eyelid.  No fever, drainage of pus or bleeding, eyelid pain or swelling.  Has not used warm compresses or any other measures.  No current facility-administered medications for this encounter.  Current Outpatient Medications:    aspirin EC 81 MG tablet, Take 1 tablet (81 mg total) by mouth daily., Disp: 30 tablet, Rfl: 11   Cholecalciferol (VITAMIN D) 50 MCG (2000 UT) CAPS, Take 1 capsule by mouth daily., Disp: , Rfl:    nitroGLYCERIN (NITROSTAT) 0.4 MG SL tablet, Place 1 tablet (0.4 mg total) under the tongue every 5 (five) minutes x 3 doses as needed for chest pain., Disp: 20 tablet, Rfl: 1   pravastatin (PRAVACHOL) 40 MG tablet, Take 1 tablet (40 mg total) by mouth every evening., Disp: 90 tablet, Rfl: 3   No Known Allergies  Past Medical History:  Diagnosis Date   Barrett's esophagus    EGD on 05/04/2013, next due in 5/18, [facility]   BCC (basal cell carcinoma), arm 03/2013   Right forearm   Blepharitis, right eye 2013   BPH (benign prostatic hyperplasia)    Coronary artery disease    Non-obstructive per Dr. Dulce Sellar   GERD (gastroesophageal reflux disease)    Heart murmur    History of Helicobacter pylori infection 05/2013   History of kidney stones    Mitral regurgitation 2009   mild per ECHO   Pneumonia    Shingles 2013   Varicose veins    bilateral     Past Surgical History:  Procedure Laterality Date   COLONOSCOPY     CRANIECTOMY Right 05/01/2022   Procedure: Right Microvascular decompression of the fifth cranial nerve;  Surgeon: Jadene Pierini, MD;  Location: [facility] OR;  Service: Neurosurgery;  Laterality: Right;  RM 21 TO FOLLOW   LEFT HEART CATH AND  CORONARY ANGIOGRAPHY N/A 07/03/2018   Procedure: LEFT HEART CATH AND CORONARY ANGIOGRAPHY;  Surgeon: Swaziland, [name], MD;  Location: [facility] INVASIVE CV LAB;  Service: Cardiovascular;  Laterality: N/A;   SPIROMETRY  03/18/2013   [facility]   TONSILLECTOMY     TRIGEMINAL NERVE DECOMPRESSION     UPPER GASTROINTESTINAL ENDOSCOPY     WISDOM TOOTH EXTRACTION      Family History  Problem Relation Age of Onset   CAD Father 31   Hypertension Father    Prostate cancer Father 76   Valvular heart disease Father    Valvular heart disease Brother        17   CAD Brother        93   Diabetes Brother    Obesity Brother    Colon cancer Neg Hx        F ?   Colon polyps Neg Hx    Esophageal cancer Neg Hx    Rectal cancer Neg Hx    Stomach cancer Neg Hx     Social History   Tobacco Use   Smoking status: Never    Passive exposure: Never   Smokeless tobacco: Former    Types: Chew   Tobacco comments:    Rarely, sees dentist regularly, not ready to quit   Vaping Use   Vaping status: Never Used  Substance Use Topics   Alcohol use: Yes    Alcohol/week: 8.0 standard drinks of alcohol    Types: 8 Cans of beer per week    Comment: 2 times week    Drug use: No    ROS   Objective:   Vitals: BP 136/86 (BP Location: Right Arm)   Pulse 70   Temp 97.7 F (36.5 C) (Oral)   Resp 16   SpO2 95%   Physical Exam Constitutional:      General: He is not in acute distress.    Appearance: Normal appearance. He is well-developed and normal weight. He is not ill-appearing, toxic-appearing or diaphoretic.  HENT:     Head: Normocephalic and atraumatic.     Right Ear: External ear normal.     Left Ear: External ear normal.     Nose: Nose normal.     Mouth/Throat:     Pharynx: Oropharynx is clear.  Eyes:     General: Lids are everted, no foreign bodies appreciated. No scleral icterus.       Right eye: No foreign body, discharge or hordeolum.        Left eye: Hordeolum present.No  foreign body or discharge.     Extraocular Movements: Extraocular movements intact.     Conjunctiva/sclera:     Right eye: Right conjunctiva is not injected. No chemosis, exudate or hemorrhage.    Left eye: Left conjunctiva is not injected. No chemosis, exudate or hemorrhage.  Cardiovascular:     Rate and Rhythm: Normal rate.  Pulmonary:     Effort: Pulmonary effort is normal.  Musculoskeletal:     Cervical back: Normal range of motion.  Neurological:     Mental Status: He is alert and oriented to person, place, and time.  Psychiatric:        Mood and Affect: Mood normal.        Behavior: Behavior normal.        Thought Content: Thought content normal.        Judgment: Judgment normal.     Assessment and Plan :   PDMP not reviewed this encounter.  1. Hordeolum externum of left lower eyelid    Patient requested I&D but I advised that this is not appropriate medical care.  Recommended warm compresses.  Discussed signs and symptoms of secondary infection.  Patient will call the clinic if these develop on a day that I am here.  Was instructed to return to the clinic for recheck in person otherwise if I am not here.  Counseled patient on potential for adverse effects with medications prescribed/recommended today, ER and return-to-clinic precautions discussed, patient verbalized understanding.    Wallis Bamberg, [place] 03/11/23 [phone]

## 2023-03-13 ENCOUNTER — Other Ambulatory Visit: Payer: Self-pay | Admitting: Cardiology

## 2023-05-09 DIAGNOSIS — L814 Other melanin hyperpigmentation: Secondary | ICD-10-CM | POA: Diagnosis not present

## 2023-05-09 DIAGNOSIS — D2271 Melanocytic nevi of right lower limb, including hip: Secondary | ICD-10-CM | POA: Diagnosis not present

## 2023-05-09 DIAGNOSIS — D485 Neoplasm of uncertain behavior of skin: Secondary | ICD-10-CM | POA: Diagnosis not present

## 2023-05-09 DIAGNOSIS — L570 Actinic keratosis: Secondary | ICD-10-CM | POA: Diagnosis not present

## 2023-05-09 DIAGNOSIS — L218 Other seborrheic dermatitis: Secondary | ICD-10-CM | POA: Diagnosis not present

## 2023-05-09 DIAGNOSIS — Z85828 Personal history of other malignant neoplasm of skin: Secondary | ICD-10-CM | POA: Diagnosis not present

## 2023-05-09 DIAGNOSIS — D225 Melanocytic nevi of trunk: Secondary | ICD-10-CM | POA: Diagnosis not present

## 2023-05-31 ENCOUNTER — Encounter: Payer: Self-pay | Admitting: Internal Medicine

## 2023-05-31 ENCOUNTER — Ambulatory Visit (INDEPENDENT_AMBULATORY_CARE_PROVIDER_SITE_OTHER): Payer: Medicare Other | Admitting: Internal Medicine

## 2023-05-31 VITALS — BP 116/78 | HR 73 | Temp 97.9°F | Resp 16 | Ht 74.0 in | Wt 226.5 lb

## 2023-05-31 DIAGNOSIS — E559 Vitamin D deficiency, unspecified: Secondary | ICD-10-CM | POA: Diagnosis not present

## 2023-05-31 DIAGNOSIS — I2510 Atherosclerotic heart disease of native coronary artery without angina pectoris: Secondary | ICD-10-CM | POA: Diagnosis not present

## 2023-05-31 DIAGNOSIS — K22719 Barrett's esophagus with dysplasia, unspecified: Secondary | ICD-10-CM

## 2023-05-31 DIAGNOSIS — E785 Hyperlipidemia, unspecified: Secondary | ICD-10-CM

## 2023-05-31 DIAGNOSIS — Z0000 Encounter for general adult medical examination without abnormal findings: Secondary | ICD-10-CM | POA: Diagnosis not present

## 2023-05-31 DIAGNOSIS — N400 Enlarged prostate without lower urinary tract symptoms: Secondary | ICD-10-CM

## 2023-05-31 LAB — CBC WITH DIFFERENTIAL/PLATELET
Basophils Absolute: 0 10*3/uL (ref 0.0–0.1)
Basophils Relative: 0.7 % (ref 0.0–3.0)
Eosinophils Absolute: 0.1 10*3/uL (ref 0.0–0.7)
Eosinophils Relative: 3.4 % (ref 0.0–5.0)
HCT: 43.5 % (ref 39.0–52.0)
Hemoglobin: 14.8 g/dL (ref 13.0–17.0)
Lymphocytes Relative: 31.3 % (ref 12.0–46.0)
Lymphs Abs: 1.2 10*3/uL (ref 0.7–4.0)
MCHC: 34.1 g/dL (ref 30.0–36.0)
MCV: 92.1 fl (ref 78.0–100.0)
Monocytes Absolute: 0.5 10*3/uL (ref 0.1–1.0)
Monocytes Relative: 12.3 % — ABNORMAL HIGH (ref 3.0–12.0)
Neutro Abs: 2.1 10*3/uL (ref 1.4–7.7)
Neutrophils Relative %: 52.3 % (ref 43.0–77.0)
Platelets: 221 10*3/uL (ref 150.0–400.0)
RBC: 4.72 Mil/uL (ref 4.22–5.81)
RDW: 12.9 % (ref 11.5–15.5)
WBC: 4 10*3/uL (ref 4.0–10.5)

## 2023-05-31 LAB — COMPREHENSIVE METABOLIC PANEL WITH GFR
ALT: 28 U/L (ref 0–53)
AST: 18 U/L (ref 0–37)
Albumin: 4.1 g/dL (ref 3.5–5.2)
Alkaline Phosphatase: 59 U/L (ref 39–117)
BUN: 18 mg/dL (ref 6–23)
CO2: 27 meq/L (ref 19–32)
Calcium: 9 mg/dL (ref 8.4–10.5)
Chloride: 104 meq/L (ref 96–112)
Creatinine, Ser: 1.16 mg/dL (ref 0.40–1.50)
GFR: 63.49 mL/min (ref >60.00)
Glucose, Bld: 98 mg/dL (ref 70–99)
Potassium: 4.8 meq/L (ref 3.5–5.1)
Sodium: 138 meq/L (ref 135–145)
Total Bilirubin: 1.2 mg/dL (ref 0.2–1.2)
Total Protein: 6.4 g/dL (ref 6.0–8.3)

## 2023-05-31 LAB — LIPID PANEL
Cholesterol: 112 mg/dL (ref 0–200)
HDL: 45.5 mg/dL (ref >39.00)
LDL Cholesterol: 47 mg/dL (ref 0–99)
NonHDL: 66.01
Total CHOL/HDL Ratio: 2
Triglycerides: 94 mg/dL (ref 0.0–149.0)
VLDL: 18.8 mg/dL (ref 0.0–40.0)

## 2023-05-31 LAB — PSA: PSA: 0.31 ng/mL (ref 0.10–4.00)

## 2023-05-31 LAB — VITAMIN D 25 HYDROXY (VIT D DEFICIENCY, FRACTURES): VITD: 32.46 ng/mL (ref 30.00–100.00)

## 2023-05-31 NOTE — Assessment & Plan Note (Signed)
  Preventive care reviewed  -Td 2016 - PNM 23: 2020; PNM: 20 : 2023;  s/p Shingrix:  -Vaccines I recommend: COVID booster (from 09/2022),  flu shot every fall. -CCS: Dr Onesimo Bijou: Colonoscopy   05-2010, 05/18/2013, Colonoscopy 11-2016 at [facility]: No polyps, internal hemorrhoids, 10 years -Prostate ca screening: Saw urology 06/2021, dx BPH, mild symptoms, at baseline.  Check PSA.  -Labs:CMP FLP CBC PSA -Healthcare POA: Recommend to bring a copy for his file

## 2023-05-31 NOTE — Progress Notes (Signed)
 Subjective:    Patient ID: Jacob Ray, male    DOB: [birth year], 73 y.o.   MRN: [id number]  DOS:  05/31/2023 Type of visit - description: Routine checkup   Chronic medical problems addressed.  In general feels well. Denies chest pain or difficulty breathing, mild LUTS at baseline.  He remains very active without problems.   Review of Systems See above   Past Medical History:  Diagnosis Date   Barrett's esophagus    EGD on 05/04/2013, next due in 5/18, [facility]   BCC (basal cell carcinoma), arm 03/2013   Right forearm   Blepharitis, right eye 2013   BPH (benign prostatic hyperplasia)    Coronary artery disease    Non-obstructive per Dr. Sandee Crook   GERD (gastroesophageal reflux disease)    Heart murmur    History of Helicobacter pylori infection 05/2013   History of kidney stones    Mitral regurgitation 2009   mild per ECHO   Pneumonia    Shingles 2013   Varicose veins    bilateral    Past Surgical History:  Procedure Laterality Date   COLONOSCOPY     CRANIECTOMY Right 05/01/2022   Procedure: Right Microvascular decompression of the fifth cranial nerve;  Surgeon: Cannon Champion, MD;  Location: [facility] OR;  Service: Neurosurgery;  Laterality: Right;  RM 21 TO FOLLOW   LEFT HEART CATH AND CORONARY ANGIOGRAPHY N/A 07/03/2018   Procedure: LEFT HEART CATH AND CORONARY ANGIOGRAPHY;  Surgeon: Swaziland, [name], MD;  Location: [facility] INVASIVE CV LAB;  Service: Cardiovascular;  Laterality: N/A;   SPIROMETRY  03/18/2013   [facility]   TONSILLECTOMY     TRIGEMINAL NERVE DECOMPRESSION     UPPER GASTROINTESTINAL ENDOSCOPY     WISDOM TOOTH EXTRACTION      Current Outpatient Medications  Medication Instructions   aspirin  EC 81 mg, Daily   Cholecalciferol (VITAMIN D ) 50 MCG (2000 UT) CAPS 1 capsule, Daily   nitroGLYCERIN  (NITROSTAT ) 0.4 mg, Sublingual, Every 5 min x3 PRN   pravastatin  (PRAVACHOL ) 40 mg, Oral, Every evening       Objective:   Physical Exam BP  116/78   Pulse 73   Temp 97.9 F (36.6 C) (Oral)   Resp 16   Ht 6\' 2"  (1.88 m)   Wt 226 lb 8 oz (102.7 kg)   SpO2 97%   BMI 29.08 kg/m  General: Well developed, NAD, BMI noted Neck: No  thyromegaly  HEENT:  Normocephalic . Face symmetric, atraumatic Lungs:  CTA B Normal respiratory effort, no intercostal retractions, no accessory muscle use. Heart: RRR,  no murmur.  Abdomen:  Not distended, soft, non-tender. No rebound or rigidity.   Lower extremities: no pretibial edema bilaterally  Skin: Exposed areas without rash. Not pale. Not jaundice Neurologic:  alert & oriented X3.  Speech normal, gait appropriate for age and unassisted Strength symmetric and appropriate for age.  Psych: Cognition and judgment appear intact.  Cooperative with normal attention span and concentration.  Behavior appropriate. No anxious or depressed appearing.     Assessment     Assessment  GERD --   Dr Onesimo Bijou , s/p EGD x 2, no major findings per pt  Esophageal  stricture, dilatation 07/12/2010  Barrett's esophagus, last EGD 05/04/2013: Pathology: Barrett's esophagitis, eosinophilic esophagitis, eosinophilic gastritis, gastric polyp, H. pylori infection. H. pylori testing 11-2015: Negative EGD 2018: + Bx for Barrett's EGD: 11-2019 : Barrett's , next 3 to 5 years  CAD: had CP, cath  07/2018: Mild single-vessel disease.  Medical therapy H/o  BPH, has seen urology. Skin cancer: BCC ,2015 . Sees derm as off 05/2023 Vitamin D  deficiency H/o kidney stone ~ 09-2015 per pt report Varicose veins, right ankle HOH- hearing ids   PLAN: CAD, mild single-vessel disease, saw cardiology 01/2023.  LDL 75, pravastatin  was increased to 80 good compliance, check labs. Dyslipidemia: See above GERD/Barrett's: Not on PPIs, currently asymptomatic.  EGD due 2026 Vitamin D  deficiency: On multivitamin most days, rec OTC 2000 units daily.  Check levels Preventive care reviewed  -Td 2016 - PNM 23: 2020; PNM: 20 : 2023;   s/p Shingrix:  -Vaccines I recommend: COVID booster (from 09/2022),  flu shot every fall. -CCS: Dr Onesimo Bijou: Colonoscopy   05-2010, 05/18/2013, Colonoscopy 11-2016 at [facility]: No polyps, internal hemorrhoids, 10 years -Prostate ca screening: Saw urology 06/2021, dx BPH, mild symptoms, at baseline.  Check PSA.  -Labs:CMP FLP CBC PSA -Healthcare POA: Recommend to bring a copy for his file RTC 6 to 8 months

## 2023-05-31 NOTE — Patient Instructions (Signed)
 Since to consider: COVID booster released on the 2024. Flu shot every fall   Vitamin D  2000 units daily  GO TO THE LAB :  Get the blood work   Your results will be posted on MyChart with my comments  Next office visit for a checkup in 6 to 8 months Please make an appointment before you leave today

## 2023-05-31 NOTE — Assessment & Plan Note (Signed)
 CAD, mild single-vessel disease, saw cardiology 01/2023.  LDL 75, pravastatin  was increased to 80 good compliance, check labs. Dyslipidemia: See above GERD/Barrett's: Not on PPIs, currently asymptomatic.  EGD due 2026 Vitamin D  deficiency: On multivitamin most days, rec OTC 2000 units daily.  Check levels Preventive care reviewed  RTC 6 to 8 months

## 2023-06-03 ENCOUNTER — Ambulatory Visit: Payer: Self-pay | Admitting: Internal Medicine

## 2023-07-12 DIAGNOSIS — H0100A Unspecified blepharitis right eye, upper and lower eyelids: Secondary | ICD-10-CM | POA: Diagnosis not present

## 2023-07-12 DIAGNOSIS — H40023 Open angle with borderline findings, high risk, bilateral: Secondary | ICD-10-CM | POA: Diagnosis not present

## 2023-07-12 DIAGNOSIS — Z961 Presence of intraocular lens: Secondary | ICD-10-CM | POA: Diagnosis not present

## 2023-07-12 DIAGNOSIS — H019 Unspecified inflammation of eyelid: Secondary | ICD-10-CM | POA: Diagnosis not present

## 2024-02-04 ENCOUNTER — Ambulatory Visit

## 2024-02-04 ENCOUNTER — Ambulatory Visit: Admitting: Internal Medicine

## 2024-02-05 ENCOUNTER — Encounter: Payer: Self-pay | Admitting: Internal Medicine

## 2024-02-05 ENCOUNTER — Ambulatory Visit: Admitting: Internal Medicine

## 2024-02-05 VITALS — BP 122/66 | HR 55 | Temp 97.5°F | Resp 18 | Ht 74.0 in | Wt 228.1 lb

## 2024-02-05 DIAGNOSIS — Z23 Encounter for immunization: Secondary | ICD-10-CM

## 2024-02-05 DIAGNOSIS — E785 Hyperlipidemia, unspecified: Secondary | ICD-10-CM

## 2024-02-05 DIAGNOSIS — I2510 Atherosclerotic heart disease of native coronary artery without angina pectoris: Secondary | ICD-10-CM

## 2024-02-05 NOTE — Progress Notes (Unsigned)
 "  Subjective:    Patient ID: Jacob Ray, male    DOB: August 21, 1950, 75 y.o.   MRN: [id number]  DOS:  02/05/2024 Follow-up  Discussed the use of AI scribe software for clinical note transcription with the patient, who gave verbal consent to proceed.  History of Present Illness Jacob Ray is a 74 year old male who presents for a routine follow-up.  He takes pravastatin  for hyperlipidemia with LDL 47 and is on aspirin  for cardiovascular protection. He has no chest pain, dyspnea, nausea, or vomiting.  He has a strong family history of cardiovascular disease with brothers who required bypass surgery and stents. 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  He takes vitamin D  for deficiency and recently started vitamin B12; has a h/o of trigeminal neuralgia and hopes to vitamins will prevent recurrence.  He feels he does not drink enough water.      Review of Systems See above   Past Medical History:  Diagnosis Date   Barrett's esophagus    EGD on 05/04/2013, next due in 5/18, [facility]   BCC (basal cell carcinoma), arm 03/2013   Right forearm   Blepharitis, right eye 2013   BPH (benign prostatic hyperplasia)    Coronary artery disease    Non-obstructive per Dr. Monetta   GERD (gastroesophageal reflux disease)    Heart murmur    History of Helicobacter pylori infection 05/2013   History of kidney stones    Mitral regurgitation 2009   mild per ECHO   Pneumonia    Shingles 2013   Varicose veins    bilateral    Past Surgical History:  Procedure Laterality Date   COLONOSCOPY     CRANIECTOMY Right 05/01/2022   Procedure: Right Microvascular decompression of the fifth cranial nerve;  Surgeon: Cheryle Debby LABOR, MD;  Location: [facility] OR;  Service: Neurosurgery;  Laterality: Right;  RM 21 TO FOLLOW   LEFT HEART CATH AND CORONARY ANGIOGRAPHY N/A 07/03/2018   Procedure: LEFT HEART CATH AND CORONARY ANGIOGRAPHY;  Surgeon: Jordan,  [name], MD;  Location: [facility] INVASIVE CV LAB;  Service: Cardiovascular;  Laterality: N/A;   SPIROMETRY  03/18/2013   [facility]   TONSILLECTOMY     TRIGEMINAL NERVE DECOMPRESSION     UPPER GASTROINTESTINAL ENDOSCOPY     WISDOM TOOTH EXTRACTION      Current Outpatient Medications  Medication Instructions   aspirin  EC 81 mg, Daily   Cholecalciferol (VITAMIN D ) 50 MCG (2000 UT) CAPS 1 capsule, Daily   cyanocobalamin (VITAMIN B12) 500 mcg, Daily   nitroGLYCERIN  (NITROSTAT ) 0.4 mg, Sublingual, Every 5 min x3 PRN   pravastatin  (PRAVACHOL ) 40 mg, Oral, Every evening       Objective:   Physical Exam BP 122/66   Pulse (!) 55   Temp (!) 97.5 F (36.4 C) (Oral)   Resp 18   Ht 6' 2 (1.88 m)   Wt 228 lb 2 oz (103.5 kg)   SpO2 97%   BMI 29.29 kg/m  General:   Well developed, NAD, BMI noted. HEENT:  Normocephalic . Face symmetric, atraumatic Lungs:  CTA B Normal respiratory effort, no intercostal retractions, no accessory muscle use. Heart: RRR,  no murmur.  Lower extremities: no pretibial edema bilaterally  Skin: Not pale. Not jaundice Neurologic:  alert & oriented X3.  Speech normal, gait appropriate for age and unassisted Psych--  Cognition and judgment appear intact.  Cooperative with normal attention span  and concentration.  Behavior appropriate. No anxious or depressed appearing.      Assessment    Problem list: GERD --   Dr Zulema , s/p EGD x 2, no major findings per pt  Esophageal  stricture, dilatation 07/12/2010  Barrett's esophagus, last EGD 05/04/2013: Pathology: Barrett's esophagitis, eosinophilic esophagitis, eosinophilic gastritis, gastric polyp, H. pylori infection. H. pylori testing 11-2015: Negative EGD 2018: + Bx for Barrett's EGD: 11-2019 : Barrett's , next 3 to 5 years  CAD: had CP, cath 07/2018: Mild single-vessel disease.  Medical therapy H/o  BPH, has seen urology. Skin cancer: BCC ,2015 . Sees derm as off 05/2023 Vitamin D   deficiency H/o kidney stone ~ 09-2015 per pt report Varicose veins, right ankle HOH- hearing ids    Assessment & Plan Dyslipidemia: On pravastatin , last LDL 47.  No change CAD, mild single-vessel disease Currently asymptomatic, he is concerned about his family history.  FLP results reviewed with the patient, he is at goal.  Continue pravastatin , aspirin . Encourage dietary modifications to reduce fats and starches, and increase fruits and vegetables. Vitamin D  deficiency   Managed with supplementation. Continue cholecalciferol (vitamin D ) daily. Preventive care: Flu shot today, recommend to consider COVID-vaccine and a Tdap at the pharmacy RTC 4 to 5 months     "

## 2024-02-05 NOTE — Patient Instructions (Signed)
 Please read your instructions carefully.     Go to the front desk for the checkout Please make an appointment for a checkup in 4 to 5 months   Consider COVID-vaccine Consider tetanus shot (specifically Tdap)

## 2024-02-06 NOTE — Assessment & Plan Note (Signed)
 Dyslipidemia: On pravastatin , last LDL 47.  No change CAD, mild single-vessel disease Currently asymptomatic, he is concerned about his family history.  FLP results reviewed with the patient, he is at goal.  Continue pravastatin , aspirin . Encourage dietary modifications to reduce fats and starches, and increase fruits and vegetables. Vitamin D  deficiency   Managed with supplementation. Continue cholecalciferol (vitamin D ) daily. Preventive care: Flu shot today, recommend to consider COVID-vaccine and a Tdap at the pharmacy RTC 4 to 5 months

## 2024-03-18 ENCOUNTER — Encounter: Admitting: Internal Medicine

## 2024-07-06 ENCOUNTER — Ambulatory Visit: Admitting: Internal Medicine
